# Patient Record
Sex: Female | Born: 1970 | Race: Black or African American | Hispanic: No | Marital: Married | Smoking: Never smoker
Health system: Southern US, Community
[De-identification: ages and names within clinical notes are randomized; demographics above are authoritative.]

## PROBLEM LIST (undated history)

## (undated) DIAGNOSIS — M329 Systemic lupus erythematosus, unspecified: Secondary | ICD-10-CM

## (undated) DIAGNOSIS — IMO0002 Reserved for concepts with insufficient information to code with codable children: Secondary | ICD-10-CM

## (undated) DIAGNOSIS — N289 Disorder of kidney and ureter, unspecified: Secondary | ICD-10-CM

## (undated) DIAGNOSIS — E282 Polycystic ovarian syndrome: Secondary | ICD-10-CM

## (undated) DIAGNOSIS — I10 Essential (primary) hypertension: Secondary | ICD-10-CM

## (undated) HISTORY — PX: DIALYSIS FISTULA CREATION: SHX611

---

## 2017-05-21 ENCOUNTER — Encounter (HOSPITAL_BASED_OUTPATIENT_CLINIC_OR_DEPARTMENT_OTHER): Payer: Self-pay

## 2017-05-21 ENCOUNTER — Emergency Department (HOSPITAL_BASED_OUTPATIENT_CLINIC_OR_DEPARTMENT_OTHER)
Admission: EM | Admit: 2017-05-21 | Discharge: 2017-05-21 | Disposition: A | Discharge status: Home / Self Care | Payer: BLUE CROSS/BLUE SHIELD | Attending: Emergency Medicine | Admitting: Emergency Medicine

## 2017-05-21 DIAGNOSIS — M25572 Pain in left ankle and joints of left foot: Secondary | ICD-10-CM | POA: Diagnosis not present

## 2017-05-21 DIAGNOSIS — I10 Essential (primary) hypertension: Secondary | ICD-10-CM | POA: Diagnosis not present

## 2017-05-21 HISTORY — DX: Reserved for concepts with insufficient information to code with codable children: IMO0002

## 2017-05-21 HISTORY — DX: Systemic lupus erythematosus, unspecified: M32.9

## 2017-05-21 HISTORY — DX: Disorder of kidney and ureter, unspecified: N28.9

## 2017-05-21 HISTORY — DX: Polycystic ovarian syndrome: E28.2

## 2017-05-21 HISTORY — DX: Essential (primary) hypertension: I10

## 2017-05-21 MED ORDER — PREDNISONE 10 MG PO TABS
60.0000 mg | ORAL_TABLET | Freq: Once | ORAL | Status: AC
  Administered 2017-05-21: 60 mg via ORAL
  Filled 2017-05-21: qty 1

## 2017-05-21 MED ORDER — PREDNISONE 10 MG (21) PO TBPK
ORAL_TABLET | Freq: Every day | ORAL | 0 refills | Status: DC
Start: 2017-05-21 — End: 2017-09-21

## 2017-05-21 NOTE — ED Triage Notes (Signed)
C/o swelling to left ankle-hx of same-denies injury-NAD-presents to triage in w/c

## 2017-05-21 NOTE — Discharge Instructions (Signed)
Take steroids for the next week Return for worsening symptoms

## 2017-05-21 NOTE — ED Notes (Signed)
ED Provider at bedside. 

## 2017-05-21 NOTE — ED Provider Notes (Signed)
[facility] Provider Note   CSN: [id number] Arrival date & time: 05/21/17  2020     History   Chief Complaint Chief Complaint  Patient presents with  . Joint Swelling    HPI Sara Frazier is a 46 y.o. female who presents with achy joints and left ankle pain and swelling. Past medical history significant for lupus and end-stage renal disease. She states that she's had aching hips and knees for the past week because of the rain. Today she developed severe left ankle pain and swelling. She says she's had this before and been treated with steroids with relief. Her last flare was in April. She denies history of diabetes. She is from [place] however had to have surgery in the area for a fistula which was placed in July.   HPI  Past Medical History:  Diagnosis Date  . Hypertension   . Kidney disease   . Lupus   . PCOS (polycystic ovarian syndrome)     There are no active problems to display for this patient.   Past Surgical History:  Procedure Laterality Date  . DIALYSIS FISTULA CREATION      OB History    No data available       Home Medications    Prior to Admission medications   Not on File    Family History No family history on file.  Social History Social History  Substance Use Topics  . Smoking status: Never Smoker  . Smokeless tobacco: Never Used  . Alcohol use No     Allergies   Lisinopril and Nitroglycerin   Review of Systems Review of Systems  Constitutional: Negative for fever.  Musculoskeletal: Positive for arthralgias, gait problem and joint swelling. Negative for myalgias.  Skin: Negative for wound.  Neurological: Negative for weakness and numbness.     Physical Exam Updated Vital Signs BP 140/82 (BP Location: Right Arm)   Pulse 76   Temp 100.2 F (37.9 C) (Oral)   Resp 20   Ht 5\' 5"  (1.651 m)   Wt 135.6 kg (299 lb)   SpO2 100%   BMI 49.76 kg/m   Physical Exam  Constitutional: She is oriented to person,  place, and time. She appears well-developed and well-nourished. No distress.  HENT:  Head: Normocephalic and atraumatic.  Eyes: Pupils are equal, round, and reactive to light. Conjunctivae are normal. Right eye exhibits no discharge. Left eye exhibits no discharge. No scleral icterus.  Neck: Normal range of motion.  Cardiovascular: Normal rate.   Pulmonary/Chest: Effort normal. No respiratory distress.  Abdominal: She exhibits no distension.  Musculoskeletal:  Left ankle: Moderate amount of swelling of the ankle. No redness or warmth. Tenderness to palpation of ankle. FROM. N/V intact.   Neurological: She is alert and oriented to person, place, and time.  Skin: Skin is warm and dry.  Psychiatric: She has a normal mood and affect. Her behavior is normal.  Nursing note and vitals reviewed.    ED Treatments / Results  Labs (all labs ordered are listed, but only abnormal results are displayed) Labs Reviewed - No data to display  EKG  EKG Interpretation None       Radiology No results found.  Procedures Procedures (including critical care time)  Medications Ordered in ED Medications  predniSONE (DELTASONE) tablet 60 mg (60 mg Oral Given 05/21/17 2130)     Initial Impression / Assessment and Plan / ED Course  I have reviewed the triage vital signs and the nursing notes.  Pertinent labs & imaging results that were available during my care of the patient were reviewed by me and considered in my medical decision making (see chart for details).  46 year old female with ankle pain and swelling likely due to lupus flare. She has an elevated temperature but vital signs are otherwise normal. She states that she has had this pain before and is usually treated with steroids. We will prescribe steroid taper. Return precautions given.  Final Clinical Impressions(s) / ED Diagnoses   Final diagnoses:  Acute left ankle pain    New Prescriptions New Prescriptions   No medications on  file     Bethel BornGekas, Katniss Weedman Marie, Cordelia Poche-C 05/22/17 0050    Alvira MondaySchlossman, Erin, MD 05/26/17 2216

## 2017-08-20 ENCOUNTER — Other Ambulatory Visit: Payer: Self-pay | Admitting: Family Medicine

## 2017-08-20 DIAGNOSIS — Z1231 Encounter for screening mammogram for malignant neoplasm of breast: Secondary | ICD-10-CM

## 2017-08-21 ENCOUNTER — Other Ambulatory Visit (HOSPITAL_COMMUNITY): Payer: Self-pay | Admitting: *Deleted

## 2017-08-21 NOTE — Discharge Instructions (Signed)
Epoetin Alfa injection °What is this medicine? °EPOETIN ALFA (e POE e tin AL fa) helps your body make more red blood cells. This medicine is used to treat anemia caused by chronic kidney failure, cancer chemotherapy, or HIV-therapy. It may also be used before surgery if you have anemia. °This medicine may be used for other purposes; ask your health care provider or pharmacist if you have questions. °COMMON BRAND NAME(S): Epogen, Procrit °What should I tell my health care provider before I take this medicine? °They need to know if you have any of these conditions: °-blood clotting disorders °-cancer patient not on chemotherapy °-cystic fibrosis °-heart disease, such as angina or heart failure °-hemoglobin level of 12 g/dL or greater °-high blood pressure °-low levels of folate, iron, or vitamin B12 °-seizures °-an unusual or allergic reaction to erythropoietin, albumin, benzyl alcohol, hamster proteins, other medicines, foods, dyes, or preservatives °-pregnant or trying to get pregnant °-breast-feeding °How should I use this medicine? °This medicine is for injection into a vein or under the skin. It is usually given by a health care professional in a hospital or clinic setting. °If you get this medicine at home, you will be taught how to prepare and give this medicine. Use exactly as directed. Take your medicine at regular intervals. Do not take your medicine more often than directed. °It is important that you put your used needles and syringes in a special sharps container. Do not put them in a trash can. If you do not have a sharps container, call your pharmacist or healthcare provider to get one. °A special MedGuide will be given to you by the pharmacist with each prescription and refill. Be sure to read this information carefully each time. °Talk to your pediatrician regarding the use of this medicine in children. While this drug may be prescribed for selected conditions, precautions do apply. °Overdosage: If you  think you have taken too much of this medicine contact a poison control center or emergency room at once. °NOTE: This medicine is only for you. Do not share this medicine with others. °What if I miss a dose? °If you miss a dose, take it as soon as you can. If it is almost time for your next dose, take only that dose. Do not take double or extra doses. °What may interact with this medicine? °Do not take this medicine with any of the following medications: °-darbepoetin alfa °This list may not describe all possible interactions. Give your health care provider a list of all the medicines, herbs, non-prescription drugs, or dietary supplements you use. Also tell them if you smoke, drink alcohol, or use illegal drugs. Some items may interact with your medicine. °What should I watch for while using this medicine? °Your condition will be monitored carefully while you are receiving this medicine. °You may need blood work done while you are taking this medicine. °What side effects may I notice from receiving this medicine? °Side effects that you should report to your doctor or health care professional as soon as possible: °-allergic reactions like skin rash, itching or hives, swelling of the face, lips, or tongue °-breathing problems °-changes in vision °-chest pain °-confusion, trouble speaking or understanding °-feeling faint or lightheaded, falls °-high blood pressure °-muscle aches or pains °-pain, swelling, warmth in the leg °-rapid weight gain °-severe headaches °-sudden numbness or weakness of the face, arm or leg °-trouble walking, dizziness, loss of balance or coordination °-seizures (convulsions) °-swelling of the ankles, feet, hands °-unusually weak or tired °  Side effects that usually do not require medical attention (report to your doctor or health care professional if they continue or are bothersome): °-diarrhea °-fever, chills (flu-like symptoms) °-headaches °-nausea, vomiting °-redness, stinging, or swelling at  site where injected °This list may not describe all possible side effects. Call your doctor for medical advice about side effects. You may report side effects to FDA at 1-800-FDA-1088. °Where should I keep my medicine? °Keep out of the reach of children. °Store in a refrigerator between 2 and 8 degrees C (36 and 46 degrees F). Do not freeze or shake. Throw away any unused portion if using a single-dose vial. Multi-dose vials can be kept in the refrigerator for up to 21 days after the initial dose. Throw away unused medicine. °NOTE: This sheet is a summary. It may not cover all possible information. If you have questions about this medicine, talk to your doctor, pharmacist, or health care provider. °© 2018 Elsevier/Gold Standard (2016-05-22 19:42:31) ° °

## 2017-08-22 ENCOUNTER — Ambulatory Visit (HOSPITAL_COMMUNITY)
Admission: RE | Admit: 2017-08-22 | Discharge: 2017-08-22 | Disposition: A | Discharge status: Home / Self Care | Payer: BLUE CROSS/BLUE SHIELD | Source: Ambulatory Visit | Attending: Nephrology | Admitting: Nephrology

## 2017-08-22 DIAGNOSIS — N185 Chronic kidney disease, stage 5: Secondary | ICD-10-CM | POA: Diagnosis not present

## 2017-08-22 DIAGNOSIS — D631 Anemia in chronic kidney disease: Secondary | ICD-10-CM | POA: Diagnosis not present

## 2017-08-22 LAB — POCT HEMOGLOBIN-HEMACUE: Hemoglobin: 10.2 g/dL — ABNORMAL LOW (ref 12.0–15.0)

## 2017-08-22 MED ORDER — EPOETIN ALFA 20000 UNIT/ML IJ SOLN
20000.0000 [IU] | Freq: Once | INTRAMUSCULAR | Status: AC
  Administered 2017-08-22: 20000 [IU] via SUBCUTANEOUS

## 2017-08-22 MED ORDER — EPOETIN ALFA 20000 UNIT/ML IJ SOLN
INTRAMUSCULAR | Status: AC
  Administered 2017-08-22: 20000 [IU] via SUBCUTANEOUS
  Filled 2017-08-22: qty 1

## 2017-08-23 ENCOUNTER — Encounter: Payer: Self-pay | Admitting: Nephrology

## 2017-08-23 DIAGNOSIS — D509 Iron deficiency anemia, unspecified: Secondary | ICD-10-CM | POA: Insufficient documentation

## 2017-08-23 DIAGNOSIS — D638 Anemia in other chronic diseases classified elsewhere: Secondary | ICD-10-CM | POA: Insufficient documentation

## 2017-08-24 ENCOUNTER — Ambulatory Visit: Payer: BLUE CROSS/BLUE SHIELD

## 2017-08-28 ENCOUNTER — Encounter (HOSPITAL_COMMUNITY): Payer: BLUE CROSS/BLUE SHIELD

## 2017-08-29 ENCOUNTER — Ambulatory Visit (INDEPENDENT_AMBULATORY_CARE_PROVIDER_SITE_OTHER): Payer: BLUE CROSS/BLUE SHIELD

## 2017-08-29 DIAGNOSIS — Z1231 Encounter for screening mammogram for malignant neoplasm of breast: Secondary | ICD-10-CM

## 2017-09-07 ENCOUNTER — Encounter (HOSPITAL_COMMUNITY)
Admission: RE | Admit: 2017-09-07 | Discharge: 2017-09-07 | Disposition: A | Discharge status: Home / Self Care | Payer: BLUE CROSS/BLUE SHIELD | Source: Ambulatory Visit | Attending: Nephrology | Admitting: Nephrology

## 2017-09-07 VITALS — BP 130/76 | HR 70 | Temp 98.3°F | Resp 18

## 2017-09-07 DIAGNOSIS — D508 Other iron deficiency anemias: Secondary | ICD-10-CM | POA: Diagnosis not present

## 2017-09-07 MED ORDER — EPOETIN ALFA 10000 UNIT/ML IJ SOLN
INTRAMUSCULAR | Status: AC
  Filled 2017-09-07: qty 1

## 2017-09-07 MED ORDER — EPOETIN ALFA 20000 UNIT/ML IJ SOLN
20000.0000 [IU] | INTRAMUSCULAR | Status: DC
  Administered 2017-09-07: 20000 [IU] via SUBCUTANEOUS

## 2017-09-10 LAB — POCT HEMOGLOBIN-HEMACUE: Hemoglobin: 8.7 g/dL — ABNORMAL LOW (ref 12.0–15.0)

## 2017-09-21 ENCOUNTER — Encounter (HOSPITAL_COMMUNITY)
Admission: RE | Admit: 2017-09-21 | Discharge: 2017-09-21 | Disposition: A | Discharge status: Home / Self Care | Payer: BLUE CROSS/BLUE SHIELD | Source: Ambulatory Visit | Attending: Nephrology | Admitting: Nephrology

## 2017-09-21 VITALS — BP 126/72 | HR 72 | Temp 98.2°F

## 2017-09-21 DIAGNOSIS — N185 Chronic kidney disease, stage 5: Secondary | ICD-10-CM | POA: Diagnosis not present

## 2017-09-21 DIAGNOSIS — D631 Anemia in chronic kidney disease: Secondary | ICD-10-CM | POA: Diagnosis not present

## 2017-09-21 DIAGNOSIS — D508 Other iron deficiency anemias: Secondary | ICD-10-CM

## 2017-09-21 MED ORDER — EPOETIN ALFA 20000 UNIT/ML IJ SOLN: 20000.0000 [IU] | INTRAMUSCULAR | Status: DC

## 2017-09-21 MED ORDER — EPOETIN ALFA 20000 UNIT/ML IJ SOLN
INTRAMUSCULAR | Status: AC
  Administered 2017-09-21: 20000 [IU]
  Filled 2017-09-21: qty 1

## 2017-09-25 LAB — POCT HEMOGLOBIN-HEMACUE: Hemoglobin: 9 g/dL — ABNORMAL LOW (ref 12.0–15.0)

## 2017-10-01 ENCOUNTER — Encounter (HOSPITAL_BASED_OUTPATIENT_CLINIC_OR_DEPARTMENT_OTHER): Payer: Self-pay

## 2017-10-01 ENCOUNTER — Other Ambulatory Visit: Payer: Self-pay

## 2017-10-01 ENCOUNTER — Emergency Department (HOSPITAL_BASED_OUTPATIENT_CLINIC_OR_DEPARTMENT_OTHER)
Admission: EM | Admit: 2017-10-01 | Discharge: 2017-10-01 | Disposition: A | Discharge status: Home / Self Care | Payer: BLUE CROSS/BLUE SHIELD | Attending: Emergency Medicine | Admitting: Emergency Medicine

## 2017-10-01 DIAGNOSIS — N185 Chronic kidney disease, stage 5: Secondary | ICD-10-CM | POA: Insufficient documentation

## 2017-10-01 DIAGNOSIS — Z79899 Other long term (current) drug therapy: Secondary | ICD-10-CM | POA: Diagnosis not present

## 2017-10-01 DIAGNOSIS — R110 Nausea: Secondary | ICD-10-CM | POA: Diagnosis present

## 2017-10-01 DIAGNOSIS — N179 Acute kidney failure, unspecified: Secondary | ICD-10-CM

## 2017-10-01 DIAGNOSIS — I120 Hypertensive chronic kidney disease with stage 5 chronic kidney disease or end stage renal disease: Secondary | ICD-10-CM | POA: Insufficient documentation

## 2017-10-01 LAB — COMPREHENSIVE METABOLIC PANEL
ALK PHOS: 49 U/L (ref 38–126)
ALT: 8 U/L — ABNORMAL LOW (ref 14–54)
ANION GAP: 10 (ref 5–15)
AST: 13 U/L — ABNORMAL LOW (ref 15–41)
Albumin: 3.5 g/dL (ref 3.5–5.0)
BILIRUBIN TOTAL: 0.3 mg/dL (ref 0.3–1.2)
BUN: 54 mg/dL — ABNORMAL HIGH (ref 6–20)
CALCIUM: 8.8 mg/dL — AB (ref 8.9–10.3)
CO2: 17 mmol/L — ABNORMAL LOW (ref 22–32)
CREATININE: 8.72 mg/dL — AB (ref 0.44–1.00)
Chloride: 107 mmol/L (ref 101–111)
GFR, EST AFRICAN AMERICAN: 6 mL/min — AB (ref >60)
GFR, EST NON AFRICAN AMERICAN: 5 mL/min — AB (ref >60)
Glucose, Bld: 95 mg/dL (ref 65–99)
Potassium: 4.1 mmol/L (ref 3.5–5.1)
Sodium: 134 mmol/L — ABNORMAL LOW (ref 135–145)
TOTAL PROTEIN: 7.2 g/dL (ref 6.5–8.1)

## 2017-10-01 LAB — CBC WITH DIFFERENTIAL/PLATELET
Basophils Absolute: 0 10*3/uL (ref 0.0–0.1)
Basophils Relative: 0 %
EOS ABS: 0 10*3/uL (ref 0.0–0.7)
Eosinophils Relative: 1 %
HEMATOCRIT: 28.4 % — AB (ref 36.0–46.0)
HEMOGLOBIN: 8.9 g/dL — AB (ref 12.0–15.0)
LYMPHS ABS: 1.5 10*3/uL (ref 0.7–4.0)
LYMPHS PCT: 38 %
MCH: 26.3 pg (ref 26.0–34.0)
MCHC: 31.3 g/dL (ref 30.0–36.0)
MCV: 83.8 fL (ref 78.0–100.0)
MONOS PCT: 12 %
Monocytes Absolute: 0.5 10*3/uL (ref 0.1–1.0)
NEUTROS PCT: 49 %
Neutro Abs: 1.9 10*3/uL (ref 1.7–7.7)
Platelets: 245 10*3/uL (ref 150–400)
RBC: 3.39 MIL/uL — ABNORMAL LOW (ref 3.87–5.11)
RDW: 14.6 % (ref 11.5–15.5)
WBC: 3.8 10*3/uL — ABNORMAL LOW (ref 4.0–10.5)

## 2017-10-01 NOTE — ED Provider Notes (Signed)
[facility] EMERGENCY DEPARTMENT Provider Note   CSN: [id number] Arrival date & time: 10/01/17  1803     History   Chief Complaint Chief Complaint  Patient presents with  . Abnormal Lab    HPI Sara Frazier is a 46 y.o. female history of lupus, hypertension, stage V kidney disease here presenting with abnormal labs.  Patient states that she had the flu earlier this year and her creatinine bumped from 3 to 5.  She states that she has a left arm fistula placed in June of this year and she just moved from [place].  She has been getting monthly lab checks to monitor her kidney function.  She had labs drawn on 12/13 that resulted today that showed BUN 60 and Cr 8.6 and resulted today and she was sent for evaluation.  Patient states that she has chronic nausea and chronic diarrhea that is not getting worse.  Had an episode of vomiting 2 days ago but has been keeping fluids down the last 2 days.  Patient also has chronic shortness of breath that is not getting worse and no chest pain.  She states that she urinates frequently.  She has seen [facility] and has a follow-up next week. She is not on meds for lupus and has multiple family members on dialysis.   The history is provided by the patient.    Past Medical History:  Diagnosis Date  . Hypertension   . Kidney disease   . Lupus   . PCOS (polycystic ovarian syndrome)     Patient Active Problem List   Diagnosis Date Noted  . Iron deficiency anemia 08/23/2017  . Anemia of chronic disease 08/23/2017    Past Surgical History:  Procedure Laterality Date  . DIALYSIS FISTULA CREATION      OB History    No data available       Home Medications    Prior to Admission medications   Medication Sig Start Date End Date Taking? Authorizing Provider  aliskiren (TEKTURNA) 150 MG tablet Take 150 mg by mouth daily. 02/15/16   [provider]  calcitRIOL (ROCALTROL) 0.25 MCG capsule Take 0.25 mcg by mouth daily.     [provider]  carvedilol (COREG) 25 MG tablet Take 25 mg by mouth 2 (two) times daily. 02/09/16   [provider]  gabapentin (NEURONTIN) 300 MG capsule Take 600 mg by mouth every other day. 02/04/16   [provider]  HYDROcodone-acetaminophen (NORCO) 7.5-325 MG tablet Take 1 tablet by mouth every 6 (six) hours as needed for pain. 12/07/15   [provider]  levothyroxine (SYNTHROID) 88 MCG tablet Take 88 mcg by mouth daily. 07/10/17 07/10/18  [provider]  montelukast (SINGULAIR) 10 MG tablet Take 10 mg by mouth at bedtime. 02/14/16 09/21/17  [provider]  Multiple Vitamin (MULTI-VITAMIN PO) Take 1 tablet by mouth daily.    [provider]  mycophenolate (CELLCEPT) 500 MG tablet Take 1,000 mg by mouth 2 (two) times daily. 02/09/16   [provider]    Family History No family history on file.  Social History Social History   Tobacco Use  . Smoking status: Never Smoker  . Smokeless tobacco: Never Used  Substance Use Topics  . Alcohol use: No  . Drug use: No     Allergies   Lisinopril and Nitroglycerin   Review of Systems Review of Systems  Gastrointestinal: Positive for nausea.  All other systems reviewed and are negative.  Physical Exam Updated Vital Signs BP (!) 141/79   Pulse 87   Temp 98.7 F (37.1 C) (Oral)   Resp 18   Ht 5' 5.5" (1.664 m)   Wt (!) 137.9 kg (304 lb)   LMP 08/20/2017   SpO2 99%   BMI 49.82 kg/m   Physical Exam  Constitutional: She is oriented to person, place, and time. She appears well-developed.  HENT:  Head: Normocephalic.  Mouth/Throat: Oropharynx is clear and moist.  Eyes: Conjunctivae and EOM are normal. Pupils are equal, round, and reactive to light.  Neck: Normal range of motion. Neck supple.  Cardiovascular: Normal rate, regular rhythm and normal heart sounds.  Pulmonary/Chest: Effort normal and breath sounds normal. No stridor. No respiratory distress.  She has no wheezes.  Abdominal: Soft. Bowel sounds are normal. She exhibits no distension. There is no tenderness. There is no guarding.  Musculoskeletal: Normal range of motion.  Neurological: She is alert and oriented to person, place, and time.  Skin: Skin is warm.  Psychiatric: She has a normal mood and affect.  Nursing note and vitals reviewed.    ED Treatments / Results  Labs (all labs ordered are listed, but only abnormal results are displayed) Labs Reviewed  CBC WITH DIFFERENTIAL/PLATELET - Abnormal; Notable for the following components:      Result Value   WBC 3.8 (*)    RBC 3.39 (*)    Hemoglobin 8.9 (*)    HCT 28.4 (*)    All other components within normal limits  COMPREHENSIVE METABOLIC PANEL - Abnormal; Notable for the following components:   Sodium 134 (*)    CO2 17 (*)    BUN 54 (*)    Creatinine, Ser 8.72 (*)    Calcium 8.8 (*)    AST 13 (*)    ALT 8 (*)    GFR calc non Af Amer 5 (*)    GFR calc Af Amer 6 (*)    All other components within normal limits  URINALYSIS, ROUTINE W REFLEX MICROSCOPIC    EKG  EKG Interpretation  Date/Time:  Monday October 01 2017 18:31:56 EST Ventricular Rate:  78 PR Interval:    QRS Duration: 89 QT Interval:  377 QTC Calculation: 430 R Axis:   63 Text Interpretation:  Sinus rhythm Probable left atrial enlargement Low voltage, precordial leads No previous ECGs available Confirmed by Richardean CanalYao, Mailey Landstrom H [id number]) on 10/01/2017 6:36:02 PM       Radiology No results found.  Procedures Procedures (including critical care time)  Medications Ordered in ED Medications - No data to display   Initial Impression / Assessment and Plan / ED Course  I have reviewed the triage vital signs and the nursing notes.  Pertinent labs & imaging results that were available during my care of the patient were reviewed by me and considered in my medical decision making (see chart for details).     Sara Frazier is a 46 y.o. female here  with renal failure. Patient has chronic nausea, chronic SOB with no worsening symptoms. Cr was 8.6 and BUN was 60 4 days ago. Will recheck labs.   7:19 PM Repeat labs showed Cr 8.7, BUN 54. K 4.1. I called Dr. Lacy Du[place], who saw patient recently. She states that patient can follow up in office as scheduled next week and doesn't need emergent dialysis. Gave strict return precautions.    Final Clinical Impressions(s) / ED Diagnoses   Final diagnoses:  None    ED Discharge Orders  None       Charlynne PanderYao, Shalicia Craghead Hsienta, MD 10/01/17 Jerene [id number]

## 2017-10-01 NOTE — Discharge Instructions (Signed)
Continue your current meds.   See your nephrologist in the office this week as scheduled   Return to ER if you have worse shortness of breath, vomiting, dehydration, fevers.

## 2017-10-01 NOTE — ED Triage Notes (Signed)
Pt had labs drawn on 12/13. Pt called and informed to come to ED due to BUN 60 and creatinine 8.6. Pt sts chronic kidney disease stage 5, pt does not received dialysis. Pt has fistula on left side.

## 2017-10-04 ENCOUNTER — Encounter (HOSPITAL_COMMUNITY)
Admission: RE | Admit: 2017-10-04 | Discharge: 2017-10-04 | Disposition: A | Discharge status: Home / Self Care | Payer: BLUE CROSS/BLUE SHIELD | Source: Ambulatory Visit | Attending: Nephrology | Admitting: Nephrology

## 2017-10-04 VITALS — BP 123/66 | HR 83 | Resp 18

## 2017-10-04 DIAGNOSIS — N185 Chronic kidney disease, stage 5: Secondary | ICD-10-CM | POA: Diagnosis not present

## 2017-10-04 DIAGNOSIS — D508 Other iron deficiency anemias: Secondary | ICD-10-CM

## 2017-10-04 LAB — POCT HEMOGLOBIN-HEMACUE: Hemoglobin: 8.5 g/dL — ABNORMAL LOW (ref 12.0–15.0)

## 2017-10-04 MED ORDER — EPOETIN ALFA 20000 UNIT/ML IJ SOLN
INTRAMUSCULAR | Status: AC
  Administered 2017-10-04: 20000 [IU] via SUBCUTANEOUS
  Filled 2017-10-04: qty 1

## 2017-10-04 MED ORDER — EPOETIN ALFA 20000 UNIT/ML IJ SOLN
20000.0000 [IU] | INTRAMUSCULAR | Status: DC
  Administered 2017-10-04: 20000 [IU] via SUBCUTANEOUS

## 2017-10-18 ENCOUNTER — Encounter (HOSPITAL_COMMUNITY)
Admission: RE | Admit: 2017-10-18 | Discharge: 2017-10-18 | Disposition: A | Discharge status: Home / Self Care | Payer: BLUE CROSS/BLUE SHIELD | Source: Ambulatory Visit | Attending: Nephrology | Admitting: Nephrology

## 2017-10-18 VITALS — BP 120/61 | HR 71 | Temp 98.6°F | Resp 18 | Ht 65.5 in | Wt 304.0 lb

## 2017-10-18 DIAGNOSIS — D508 Other iron deficiency anemias: Secondary | ICD-10-CM | POA: Diagnosis present

## 2017-10-18 LAB — RENAL FUNCTION PANEL
ANION GAP: 9 (ref 5–15)
Albumin: 3.1 g/dL — ABNORMAL LOW (ref 3.5–5.0)
BUN: 58 mg/dL — ABNORMAL HIGH (ref 6–20)
CALCIUM: 8.1 mg/dL — AB (ref 8.9–10.3)
CHLORIDE: 109 mmol/L (ref 101–111)
CO2: 18 mmol/L — ABNORMAL LOW (ref 22–32)
Creatinine, Ser: 9.69 mg/dL — ABNORMAL HIGH (ref 0.44–1.00)
GFR, EST AFRICAN AMERICAN: 5 mL/min — AB (ref >60)
GFR, EST NON AFRICAN AMERICAN: 4 mL/min — AB (ref >60)
Glucose, Bld: 90 mg/dL (ref 65–99)
Phosphorus: 5 mg/dL — ABNORMAL HIGH (ref 2.5–4.6)
Potassium: 4.5 mmol/L (ref 3.5–5.1)
Sodium: 136 mmol/L (ref 135–145)

## 2017-10-18 LAB — POCT HEMOGLOBIN-HEMACUE: HEMOGLOBIN: 8.4 g/dL — AB (ref 12.0–15.0)

## 2017-10-18 MED ORDER — DARBEPOETIN ALFA 200 MCG/0.4ML IJ SOSY
PREFILLED_SYRINGE | INTRAMUSCULAR | Status: AC
  Administered 2017-10-18: 200 ug
  Filled 2017-10-18: qty 0.4

## 2017-10-18 MED ORDER — FERUMOXYTOL INJECTION 510 MG/17 ML
510.0000 mg | INTRAVENOUS | Status: DC
  Administered 2017-10-18: 510 mg via INTRAVENOUS
  Filled 2017-10-18: qty 17

## 2017-10-18 MED ORDER — DARBEPOETIN ALFA 200 MCG/0.4ML IJ SOSY: 200.0000 ug | PREFILLED_SYRINGE | INTRAMUSCULAR | Status: DC

## 2017-10-18 NOTE — Discharge Instructions (Signed)

## 2017-10-19 ENCOUNTER — Encounter (HOSPITAL_COMMUNITY): Payer: Medicare Other

## 2017-10-19 LAB — PTH, INTACT AND CALCIUM
Calcium, Total (PTH): 7.8 mg/dL — ABNORMAL LOW (ref 8.7–10.2)
PTH: 498 pg/mL — ABNORMAL HIGH (ref 15–65)

## 2017-10-19 LAB — HEPATITIS B SURFACE ANTIGEN: Hepatitis B Surface Ag: NEGATIVE

## 2017-10-25 ENCOUNTER — Ambulatory Visit (HOSPITAL_COMMUNITY)
Admission: RE | Admit: 2017-10-25 | Discharge: 2017-10-25 | Disposition: A | Discharge status: Home / Self Care | Payer: BLUE CROSS/BLUE SHIELD | Source: Ambulatory Visit | Attending: Nephrology | Admitting: Nephrology

## 2017-10-25 VITALS — BP 124/68 | HR 64 | Temp 98.6°F | Resp 18 | Ht 65.5 in | Wt 302.0 lb

## 2017-10-25 DIAGNOSIS — D631 Anemia in chronic kidney disease: Secondary | ICD-10-CM | POA: Insufficient documentation

## 2017-10-25 DIAGNOSIS — D508 Other iron deficiency anemias: Secondary | ICD-10-CM | POA: Diagnosis present

## 2017-10-25 DIAGNOSIS — N189 Chronic kidney disease, unspecified: Secondary | ICD-10-CM | POA: Diagnosis not present

## 2017-10-25 LAB — POCT HEMOGLOBIN-HEMACUE: Hemoglobin: 8.9 g/dL — ABNORMAL LOW (ref 12.0–15.0)

## 2017-10-25 MED ORDER — SODIUM CHLORIDE 0.9 % IV SOLN
510.0000 mg | INTRAVENOUS | Status: DC
  Administered 2017-10-25: 510 mg via INTRAVENOUS
  Filled 2017-10-25: qty 17

## 2017-10-25 MED ORDER — DARBEPOETIN ALFA 200 MCG/0.4ML IJ SOSY
200.0000 ug | PREFILLED_SYRINGE | INTRAMUSCULAR | Status: DC
  Administered 2017-10-25: 200 ug via SUBCUTANEOUS

## 2017-10-25 MED ORDER — DARBEPOETIN ALFA 200 MCG/0.4ML IJ SOSY
PREFILLED_SYRINGE | INTRAMUSCULAR | Status: AC
  Filled 2017-10-25: qty 0.4

## 2017-11-02 ENCOUNTER — Encounter (HOSPITAL_COMMUNITY)
Admission: RE | Admit: 2017-11-02 | Discharge: 2017-11-02 | Disposition: A | Discharge status: Home / Self Care | Payer: BLUE CROSS/BLUE SHIELD | Source: Ambulatory Visit | Attending: Nephrology | Admitting: Nephrology

## 2017-11-02 VITALS — BP 123/67 | HR 73 | Temp 98.4°F | Resp 18

## 2017-11-02 DIAGNOSIS — D508 Other iron deficiency anemias: Secondary | ICD-10-CM

## 2017-11-02 LAB — IRON AND TIBC
IRON: 88 ug/dL (ref 28–170)
Saturation Ratios: 36 % — ABNORMAL HIGH (ref 10.4–31.8)
TIBC: 245 ug/dL — ABNORMAL LOW (ref 250–450)
UIBC: 157 ug/dL

## 2017-11-02 LAB — FERRITIN: FERRITIN: 737 ng/mL — AB (ref 11–307)

## 2017-11-02 LAB — POCT HEMOGLOBIN-HEMACUE: Hemoglobin: 8.5 g/dL — ABNORMAL LOW (ref 12.0–15.0)

## 2017-11-02 MED ORDER — DARBEPOETIN ALFA 200 MCG/0.4ML IJ SOSY
PREFILLED_SYRINGE | INTRAMUSCULAR | Status: AC
  Administered 2017-11-02: 200 ug via SUBCUTANEOUS
  Filled 2017-11-02: qty 0.4

## 2017-11-02 MED ORDER — DARBEPOETIN ALFA 200 MCG/0.4ML IJ SOSY
200.0000 ug | PREFILLED_SYRINGE | INTRAMUSCULAR | Status: DC
Start: 2017-11-02 — End: 2017-11-03
  Administered 2017-11-02: 200 ug via SUBCUTANEOUS

## 2017-11-09 ENCOUNTER — Encounter (HOSPITAL_COMMUNITY)
Admission: RE | Admit: 2017-11-09 | Discharge: 2017-11-09 | Disposition: A | Discharge status: Home / Self Care | Payer: BLUE CROSS/BLUE SHIELD | Source: Ambulatory Visit | Attending: Nephrology | Admitting: Nephrology

## 2017-11-09 DIAGNOSIS — D508 Other iron deficiency anemias: Secondary | ICD-10-CM | POA: Diagnosis not present

## 2017-11-09 LAB — POCT HEMOGLOBIN-HEMACUE: Hemoglobin: 8.5 g/dL — ABNORMAL LOW (ref 12.0–15.0)

## 2017-11-09 MED ORDER — DARBEPOETIN ALFA 200 MCG/0.4ML IJ SOSY
PREFILLED_SYRINGE | INTRAMUSCULAR | Status: AC
  Administered 2017-11-09: 200 ug via SUBCUTANEOUS
  Filled 2017-11-09: qty 0.4

## 2017-11-16 ENCOUNTER — Encounter (HOSPITAL_COMMUNITY)
Admission: RE | Admit: 2017-11-16 | Discharge: 2017-11-16 | Disposition: A | Discharge status: Home / Self Care | Payer: BLUE CROSS/BLUE SHIELD | Source: Ambulatory Visit | Attending: Nephrology | Admitting: Nephrology

## 2017-11-16 VITALS — BP 124/70 | HR 66 | Temp 98.1°F | Resp 18

## 2017-11-16 DIAGNOSIS — D508 Other iron deficiency anemias: Secondary | ICD-10-CM | POA: Insufficient documentation

## 2017-11-16 MED ORDER — DARBEPOETIN ALFA 200 MCG/0.4ML IJ SOSY
200.0000 ug | PREFILLED_SYRINGE | INTRAMUSCULAR | Status: DC
  Administered 2017-11-16: 200 ug via SUBCUTANEOUS

## 2017-11-16 MED ORDER — DARBEPOETIN ALFA 200 MCG/0.4ML IJ SOSY
PREFILLED_SYRINGE | INTRAMUSCULAR | Status: AC
  Administered 2017-11-16: 200 ug via SUBCUTANEOUS
  Filled 2017-11-16: qty 0.4

## 2017-11-19 LAB — POCT HEMOGLOBIN-HEMACUE: Hemoglobin: 8.5 g/dL — ABNORMAL LOW (ref 12.0–15.0)

## 2018-07-25 ENCOUNTER — Encounter: Payer: Self-pay | Admitting: Neurology

## 2018-08-28 ENCOUNTER — Ambulatory Visit (INDEPENDENT_AMBULATORY_CARE_PROVIDER_SITE_OTHER): Payer: BLUE CROSS/BLUE SHIELD | Admitting: Neurology

## 2018-08-28 ENCOUNTER — Encounter: Payer: Self-pay | Admitting: Neurology

## 2018-08-28 VITALS — BP 118/68 | HR 72 | Ht 65.5 in | Wt 292.0 lb

## 2018-08-28 DIAGNOSIS — R202 Paresthesia of skin: Secondary | ICD-10-CM

## 2018-08-28 DIAGNOSIS — S46011A Strain of muscle(s) and tendon(s) of the rotator cuff of right shoulder, initial encounter: Secondary | ICD-10-CM | POA: Diagnosis not present

## 2018-08-28 DIAGNOSIS — R200 Anesthesia of skin: Secondary | ICD-10-CM | POA: Diagnosis not present

## 2018-08-28 NOTE — Patient Instructions (Addendum)
1.  We will check nerve study of right upper extremity 2.  Start taking gabapentin 2 pills at bedtime 3.  We will provide you a script for a wrist splint.  Wear at night to see if it helps with symptoms. 4.  Follow up after testing. 5.  I would like you to see orthopedics for that shoulder as well.  We can discuss at next visit.

## 2018-08-28 NOTE — Progress Notes (Signed)
NEUROLOGY CONSULTATION NOTE  Sara Frazier MRN: [id number] DOB: February 07, 1971  Referring provider: Paulene Floor, MD Primary care provider: No PCP  Reason for consult:  paresthesias  HISTORY OF PRESENT ILLNESS: Sara Frazier is a 47 year old right-handed African American female with lupus, end-stage renal disease on hemodialysis, hypertension, and PCOS who presents for paresthesias.  She is accompanied by her mother who supplements history.  She is accompanied by her husband via telephone.  Beginning 2 months ago, she developed numbness and burning pain and swelling in the 3rd and 4th digits of both hands.  Now the left hand symptoms have resolved but the the right hand symptoms persist and radiate up the arm to the right shoulder and upper back.  There is no actual weakness but her arm and hand feels weak due to the pain and numbing sensation.  She reports 2-3 years ago, she had a fall and tore her rotator cuff but it was never fixed.  It is constant, but she notices it more when laying down or pressing on her back.  No associated neck pain.She also notices burning sensation in the 2nd and 3rd toe of the left foot.  There is no low back and radicular pain down the leg.  07/09/18:  Cr 10.3 (baseline)  PAST MEDICAL HISTORY: Past Medical History:  Diagnosis Date  . Hypertension   . Kidney disease   . Lupus   . PCOS (polycystic ovarian syndrome)     PAST SURGICAL HISTORY: Past Surgical History:  Procedure Laterality Date  . DIALYSIS FISTULA CREATION      MEDICATIONS: Current Outpatient Medications on File Prior to Visit  Medication Sig Dispense Refill  . aliskiren (TEKTURNA) 150 MG tablet Take 150 mg by mouth daily.    . calcitRIOL (ROCALTROL) 0.25 MCG capsule Take 0.25 mcg by mouth daily.    . carvedilol (COREG) 25 MG tablet Take 25 mg by mouth 2 (two) times daily.    Marland Kitchen gabapentin (NEURONTIN) 300 MG capsule Take 600 mg by mouth every other day.    Marland Kitchen HYDROcodone-acetaminophen  (NORCO) 7.5-325 MG tablet Take 1 tablet by mouth every 6 (six) hours as needed for pain.    Marland Kitchen levothyroxine (SYNTHROID) 88 MCG tablet Take 88 mcg by mouth daily.    . montelukast (SINGULAIR) 10 MG tablet Take 10 mg by mouth at bedtime.    . Multiple Vitamin (MULTI-VITAMIN PO) Take 1 tablet by mouth daily.    . mycophenolate (CELLCEPT) 500 MG tablet Take 1,000 mg by mouth 2 (two) times daily.     No current facility-administered medications on file prior to visit.     ALLERGIES: Allergies  Allergen Reactions  . Chlorhexidine Gluconate Itching and Rash  . Lisinopril   . Nitroglycerin     FAMILY HISTORY: No family history on file.  SOCIAL HISTORY: Social History   Socioeconomic History  . Marital status: Married    Spouse name: Not on file  . Number of children: Not on file  . Years of education: Not on file  . Highest education level: Not on file  Occupational History  . Not on file  Social Needs  . Financial resource strain: Not on file  . Food insecurity:    Worry: Not on file    Inability: Not on file  . Transportation needs:    Medical: Not on file    Non-medical: Not on file  Tobacco Use  . Smoking status: Never Smoker  . Smokeless tobacco: Never Used  Substance and Sexual Activity  . Alcohol use: No  . Drug use: No  . Sexual activity: Not on file  Lifestyle  . Physical activity:    Days per week: Not on file    Minutes per session: Not on file  . Stress: Not on file  Relationships  . Social connections:    Talks on phone: Not on file    Gets together: Not on file    Attends religious service: Not on file    Active member of club or organization: Not on file    Attends meetings of clubs or organizations: Not on file    Relationship status: Not on file  . Intimate partner violence:    Fear of current or ex partner: Not on file    Emotionally abused: Not on file    Physically abused: Not on file    Forced sexual activity: Not on file  Other Topics  Concern  . Not on file  Social History Narrative  . Not on file    REVIEW OF SYSTEMS: Constitutional: No fevers, chills, or sweats, no generalized fatigue, change in appetite Eyes: No visual changes, double vision, eye pain Ear, nose and throat: No hearing loss, ear pain, nasal congestion, sore throat Cardiovascular: No chest pain, palpitations Respiratory:  No shortness of breath at rest or with exertion, wheezes GastrointestinaI: No nausea, vomiting, diarrhea, abdominal pain, fecal incontinence Genitourinary:  No dysuria, urinary retention or frequency Musculoskeletal:  No neck pain, back pain Integumentary: No rash, pruritus, skin lesions Neurological: as above Psychiatric: No depression, insomnia, anxiety Endocrine: No palpitations, fatigue, diaphoresis, mood swings, change in appetite, change in weight, increased thirst Hematologic/Lymphatic:  No purpura, petechiae. Allergic/Immunologic: no itchy/runny eyes, nasal congestion, recent allergic reactions, rashes  PHYSICAL EXAM: Blood pressure 118/68, pulse 72, height 5' 5.5" (1.664 m), weight 292 lb (132.5 kg), SpO2 97 %. General: No acute distress.  Patient appears well-groomed.  Head:  Normocephalic/atraumatic Eyes:  fundi examined but not visualized Neck: supple, no paraspinal tenderness, full range of motion Back: No paraspinal tenderness Heart: regular rate and rhythm Lungs: Clear to auscultation bilaterally. Vascular: No carotid bruits. Neurological Exam: Mental status: alert and oriented to person, place, and time, recent and remote memory intact, fund of knowledge intact, attention and concentration intact, speech fluent and not dysarthric, language intact. Cranial nerves: CN I: not tested CN II: pupils equal, round and reactive to light, visual fields intact CN III, IV, VI:  full range of motion, no nystagmus, no ptosis CN V: facial sensation intact CN VII: upper and lower face symmetric CN VIII: hearing intact CN  IX, X: gag intact, uvula midline CN XI: sternocleidomastoid and trapezius muscles intact CN XII: tongue midline Bulk & Tone: normal, no fasciculations. Motor:  5/5 throughout  Sensation:  Endorses very mild decreased pinprick sensation in the left upper extremity; and vibration sensation intact. Deep Tendon Reflexes:  2+ throughout, toes downgoing.  Finger to nose testing:  Without dysmetria.  Heel to shin:  Without dysmetria.  Gait:  Normal station and stride.  Able to turn and tandem walk. Romberg negative.  IMPRESSION: Right upper extremity numbness and tingling with neuralgia.  May possibly be carpal tunnel syndrome as symptoms into fingers aggravated with Tinel's sign on wrist.  Carpal tunnel symptoms may radiate up to shoulders.  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  Numbness and tingling in toes of left foot may be mild lumbar radiculopathy  or possibly peroneal neuropathy but does not appear severe.  PLAN: 1.  NCV-EMG of right upper extremity 2.  Prescribed wrist splint to wear at night 3.  She has gabapentin which she takes off and on for pain.  Advised to start taking every night (600mg  at bedtime) 4.  I want to refer her to orthopedics for evaluation of her shoulder.  She defers at this time.  She would like to complete workup and revisit that discussion at next visit.  Thank you for allowing me to take part in the care of this patient.  Shon MilletAdam Jaffe, DO  CC:  Paulene FloorJames Lin, MD

## 2018-10-01 ENCOUNTER — Ambulatory Visit (INDEPENDENT_AMBULATORY_CARE_PROVIDER_SITE_OTHER): Payer: BLUE CROSS/BLUE SHIELD | Admitting: Neurology

## 2018-10-01 ENCOUNTER — Ambulatory Visit: Payer: Medicare Other | Admitting: Neurology

## 2018-10-01 DIAGNOSIS — R202 Paresthesia of skin: Secondary | ICD-10-CM | POA: Diagnosis not present

## 2018-10-01 DIAGNOSIS — R200 Anesthesia of skin: Secondary | ICD-10-CM | POA: Diagnosis not present

## 2018-10-01 DIAGNOSIS — G5601 Carpal tunnel syndrome, right upper limb: Secondary | ICD-10-CM

## 2018-10-01 NOTE — Procedures (Signed)
[facility]  [address], [address]  [place], [place] [address] Tel: [phone] Fax:  [phone] Test Date:  10/01/2018  Patient: Sara Frazier DOB: 1971-09-17 Physician: Nita Sickleonika Patel, DO  Sex: Female Height: 5\' 5"  Ref Phys: Shon MilletAdam Jaffe, DO  ID#: [id number] Temp: 35.0C Technician:    Patient Complaints: This is a 47 year old female with history of lupus and end-stage renal disease on hemodialysis referred for evaluation of right hand paresthesias.  NCV & EMG Findings: Sensitive electrodiagnostic testing of the right upper extremity shows:  1. Right median sensory response shows prolonged distal peak latency (4.4 ms) and reduced amplitude (18.9 V).  Right ulnar sensory response is within normal limits. 2. Right median motor response shows prolonged distal onset latency (4.0 ms) and normal amplitude.  Right ulnar motor responses within normal limits. 3. There is no evidence of active or chronic motor axonal loss changes affecting any of the tested muscles.  Motor unit configuration and recruitment pattern is within normal limits.  Impression: Right median neuropathy at or distal to the wrist, consistent with a clinical diagnosis of carpal tunnel syndrome.  Overall, these findings are moderate in degree electrically.   ___________________________ Nita Sickleonika Patel, DO    Nerve Conduction Studies Anti Sensory Summary Table   Site NR Peak (ms) Norm Peak (ms) P-T Amp (V) Norm P-T Amp  Right Median Anti Sensory (2nd Digit)  35C  Wrist    4.4 <3.4 18.9 >20  Right Ulnar Anti Sensory (5th Digit)  35C  Wrist    3.1 <3.1 44.2 >12   Motor Summary Table   Site NR Onset (ms) Norm Onset (ms) O-P Amp (mV) Norm O-P Amp Site1 Site2 Delta-0 (ms) Dist (cm) Vel (m/s) Norm Vel (m/s)  Right Median Motor (Abd Poll Brev)  35C  Wrist    4.0 <3.9 15.9 >6 Elbow Wrist 5.0 34.0 68 >50  Elbow    9.0  15.7         Right Ulnar Motor (Abd Dig Minimi)  35C  Wrist    2.6 <3.1 9.0 >7 B  Elbow Wrist 4.0 27.0 68 >50  B Elbow    6.6  8.5  A Elbow B Elbow 1.7 10.0 59 >50  A Elbow    8.3  8.4          EMG   Side Muscle Ins Act Fibs Psw Fasc Number Recrt Dur Dur. Amp Amp. Poly Poly. Comment  Right 1stDorInt Nml Nml Nml Nml Nml Nml Nml Nml Nml Nml Nml Nml N/A  Right Abd Poll Brev Nml Nml Nml Nml Nml Nml Nml Nml Nml Nml Nml Nml N/A  Right PronatorTeres Nml Nml Nml Nml Nml Nml Nml Nml Nml Nml Nml Nml N/A  Right Biceps Nml Nml Nml Nml Nml Nml Nml Nml Nml Nml Nml Nml N/A  Right Triceps Nml Nml Nml Nml Nml Nml Nml Nml Nml Nml Nml Nml N/A  Right Deltoid Nml Nml Nml Nml Nml Nml Nml Nml Nml Nml Nml Nml N/A      Waveforms:

## 2018-10-02 ENCOUNTER — Telehealth: Payer: Self-pay

## 2018-10-02 NOTE — Telephone Encounter (Signed)
-----   Message from Drema DallasAdam R Jaffe, DO sent at 10/01/2018 11:48 AM EST ----- Nerve test dose show evidence of carpal tunnel syndrome.  If wrist splints are helping, then continue using.  If not, then I would recommend referral to hand specialist .

## 2018-10-02 NOTE — Telephone Encounter (Signed)
Called [facility] for Pt to return my call 

## 2018-10-03 ENCOUNTER — Other Ambulatory Visit: Payer: Self-pay | Admitting: Family Medicine

## 2018-10-03 DIAGNOSIS — Z0000 Encounter for general adult medical examination without abnormal findings: Secondary | ICD-10-CM

## 2018-10-03 DIAGNOSIS — Z1231 Encounter for screening mammogram for malignant neoplasm of breast: Secondary | ICD-10-CM

## 2018-10-03 NOTE — Telephone Encounter (Signed)
Called and spoke with Pt, advised her of EMG results. She will consider and call back if wants referral to hand specialist

## 2018-10-24 ENCOUNTER — Ambulatory Visit: Payer: Medicare Other

## 2018-10-31 ENCOUNTER — Ambulatory Visit (INDEPENDENT_AMBULATORY_CARE_PROVIDER_SITE_OTHER): Payer: BC Managed Care – PPO

## 2018-10-31 DIAGNOSIS — Z1231 Encounter for screening mammogram for malignant neoplasm of breast: Secondary | ICD-10-CM | POA: Diagnosis not present

## 2018-12-17 NOTE — Progress Notes (Signed)
NEUROLOGY FOLLOW UP OFFICE NOTE  Sara Frazier [id number]  HISTORY OF PRESENT ILLNESS: Sara Frazier is a 48 year old right-handed African-American woman with lupus, end-stage renal disease on hemodialysis, hypertension, and PCOS who follows up for paresthesias.  She is accompanied by her mother who supplements history.  UPDATE:  She had a nerve conduction study on 10/01/2018 which demonstrated moderate right carpal tunnel syndrome.  She deferred referral to a hand specialist at that time.  She was prescribed a wrist splint,but she hasn't been using it because she hasn't found a place to buy it.    HISTORY:  In September 2019, she developed numbness and burning pain and swelling in the 3rd and 4th digits of both hands.  Now the left hand symptoms have resolved but the the right hand symptoms persist and radiate up the arm to the right shoulder and upper back.  There is no actual weakness but her arm and hand feels weak due to the pain and numbing sensation.  She reports 2-3 years ago, she had a fall and tore her rotator cuff but it was never fixed.  It is constant, but she notices it more when laying down or pressing on her back.  No associated neck pain.She also notices burning sensation in the 2nd and 3rd toe of the left foot.  There is no low back and radicular pain down the leg.  PAST MEDICAL HISTORY: Past Medical History:  Diagnosis Date  . Hypertension   . Kidney disease   . Lupus (HCC)   . PCOS (polycystic ovarian syndrome)     MEDICATIONS: Current Outpatient Medications on File Prior to Visit  Medication Sig Dispense Refill  . aliskiren (TEKTURNA) 150 MG tablet Take 150 mg by mouth daily.    Marland Kitchen atorvastatin (LIPITOR) 10 MG tablet Take 10 mg by mouth daily.    . B Complex-C-Zn-Folic Acid (DIALYVITE 800 WITH ZINC) 0.8 MG TABS Take 1 tablet by mouth daily.  4  . calcitRIOL (ROCALTROL) 0.25 MCG capsule Take 0.25 mcg by mouth daily.    . calcium acetate (PHOSLO) 667 MG  capsule TAKE 1 CAPSULE BY MOUTH THREE TIMES DAILY WITH MEALS    . carvedilol (COREG) 25 MG tablet Take 25 mg by mouth 2 (two) times daily.    . fluticasone (FLONASE) 50 MCG/ACT nasal spray INSTILL 2 SPRAYS INTO EACH NOSTRIL ONCE DAILY    . gabapentin (NEURONTIN) 300 MG capsule Take 600 mg by mouth every other day.    Marland Kitchen HYDROcodone-acetaminophen (NORCO) 7.5-325 MG tablet Take 1 tablet by mouth every 6 (six) hours as needed for pain.    Marland Kitchen levothyroxine (SYNTHROID) 88 MCG tablet Take 88 mcg by mouth daily.    . montelukast (SINGULAIR) 10 MG tablet Take 10 mg by mouth at bedtime.    . Multiple Vitamin (MULTI-VITAMIN PO) Take 1 tablet by mouth daily.    . mycophenolate (CELLCEPT) 500 MG tablet Take 1,000 mg by mouth 2 (two) times daily.    . norethindrone (AYGESTIN) 5 MG tablet Take 5 mg by mouth daily.     No current facility-administered medications on file prior to visit.     ALLERGIES: Allergies  Allergen Reactions  . Chlorhexidine Gluconate Itching and Rash  . Lisinopril   . Nitroglycerin     FAMILY HISTORY: Family History  Problem Relation Age of Onset  . Hypertension Mother   . Hypertension Father   . Diabetes Father   . Hypertension Sister   . Autoimmune disease Sister  SOCIAL HISTORY: Social History   Socioeconomic History  . Marital status: Married    Spouse name: Lyda Jester  . Number of children: 1  . Years of education: Not on file  . Highest education level: Some college, no degree  Occupational History  . Occupation: disabled  Social Needs  . Financial resource strain: Not on file  . Food insecurity:    Worry: Not on file    Inability: Not on file  . Transportation needs:    Medical: Not on file    Non-medical: Not on file  Tobacco Use  . Smoking status: Never Smoker  . Smokeless tobacco: Never Used  Substance and Sexual Activity  . Alcohol use: No  . Drug use: No  . Sexual activity: Not on file  Lifestyle  . Physical activity:    Days per week: Not  on file    Minutes per session: Not on file  . Stress: Not on file  Relationships  . Social connections:    Talks on phone: Not on file    Gets together: Not on file    Attends religious service: Not on file    Active member of club or organization: Not on file    Attends meetings of clubs or organizations: Not on file    Relationship status: Not on file  . Intimate partner violence:    Fear of current or ex partner: Not on file    Emotionally abused: Not on file    Physically abused: Not on file    Forced sexual activity: Not on file  Other Topics Concern  . Not on file  Social History Narrative   Patient is right-handed. She lives in a 2 story home with her husband. She does not drink caffeine, she does not exercise.    REVIEW OF SYSTEMS: Constitutional: No fevers, chills, or sweats, no generalized fatigue, change in appetite Eyes: No visual changes, double vision, eye pain Ear, nose and throat: No hearing loss, ear pain, nasal congestion, sore throat Cardiovascular: No chest pain, palpitations Respiratory:  No shortness of breath at rest or with exertion, wheezes GastrointestinaI: No nausea, vomiting, diarrhea, abdominal pain, fecal incontinence Genitourinary:  No dysuria, urinary retention or frequency Musculoskeletal:  No neck pain, back pain Integumentary: No rash, pruritus, skin lesions Neurological: as above Psychiatric: No depression, insomnia, anxiety Endocrine: No palpitations, fatigue, diaphoresis, mood swings, change in appetite, change in weight, increased thirst Hematologic/Lymphatic:  No purpura, petechiae. Allergic/Immunologic: no itchy/runny eyes, nasal congestion, recent allergic reactions, rashes  PHYSICAL EXAM: Blood pressure 120/74, pulse 88, height 5' 5.5" (1.664 m), weight 289 lb (131.1 kg), SpO2 99 %. General: No acute distress.  Patient appears well-groomed.   IMPRESSION: 1.  Right carpal tunnel syndrome  PLAN: 1.  She will get a wrist splint  for her right wrist. 2.  If splint ineffective, then she will contact me and we can refer her to hand specialist.  16 minutes spent face to face with patient, 100% spent discussing .  Shon Millet, DO

## 2018-12-19 ENCOUNTER — Encounter: Payer: Self-pay | Admitting: Neurology

## 2018-12-19 ENCOUNTER — Ambulatory Visit (INDEPENDENT_AMBULATORY_CARE_PROVIDER_SITE_OTHER): Payer: BLUE CROSS/BLUE SHIELD | Admitting: Neurology

## 2018-12-19 VITALS — BP 120/74 | HR 88 | Ht 65.5 in | Wt 289.0 lb

## 2018-12-19 DIAGNOSIS — G5601 Carpal tunnel syndrome, right upper limb: Secondary | ICD-10-CM | POA: Diagnosis not present

## 2018-12-19 NOTE — Patient Instructions (Signed)
1.  Try wearing the wrist splint 2.  If ineffective then contact me for referral to hand specialist

## 2018-12-19 NOTE — Addendum Note (Signed)
Addended by: Vivien Rota on: 10/21/1094 09:55 AM   Modules accepted: Orders

## 2019-08-27 ENCOUNTER — Other Ambulatory Visit: Payer: Self-pay | Admitting: Family Medicine

## 2019-08-27 DIAGNOSIS — Z1231 Encounter for screening mammogram for malignant neoplasm of breast: Secondary | ICD-10-CM

## 2019-10-30 ENCOUNTER — Ambulatory Visit: Payer: BC Managed Care – PPO

## 2019-11-13 ENCOUNTER — Ambulatory Visit: Payer: BC Managed Care – PPO

## 2019-11-20 ENCOUNTER — Ambulatory Visit (INDEPENDENT_AMBULATORY_CARE_PROVIDER_SITE_OTHER): Payer: BC Managed Care – PPO

## 2019-11-20 ENCOUNTER — Other Ambulatory Visit: Payer: Self-pay

## 2019-11-20 DIAGNOSIS — Z1231 Encounter for screening mammogram for malignant neoplasm of breast: Secondary | ICD-10-CM | POA: Diagnosis not present

## 2020-10-04 ENCOUNTER — Other Ambulatory Visit: Payer: Self-pay | Admitting: Family Medicine

## 2020-10-04 DIAGNOSIS — Z1231 Encounter for screening mammogram for malignant neoplasm of breast: Secondary | ICD-10-CM

## 2020-11-25 ENCOUNTER — Other Ambulatory Visit: Payer: Self-pay

## 2020-11-25 ENCOUNTER — Ambulatory Visit (INDEPENDENT_AMBULATORY_CARE_PROVIDER_SITE_OTHER): Payer: Medicare Other

## 2020-11-25 DIAGNOSIS — Z1231 Encounter for screening mammogram for malignant neoplasm of breast: Secondary | ICD-10-CM

## 2021-05-17 IMAGING — MG DIGITAL SCREENING BILAT W/ TOMO W/ CAD
8 of 16 series · 8 of 40 positions shown · non-contrast
Comparison: Previous exam(s).

CLINICAL DATA: Screening.

EXAM:
DIGITAL SCREENING BILATERAL MAMMOGRAM WITH TOMO AND CAD

[L MLO synth-2D (1 of 3)]
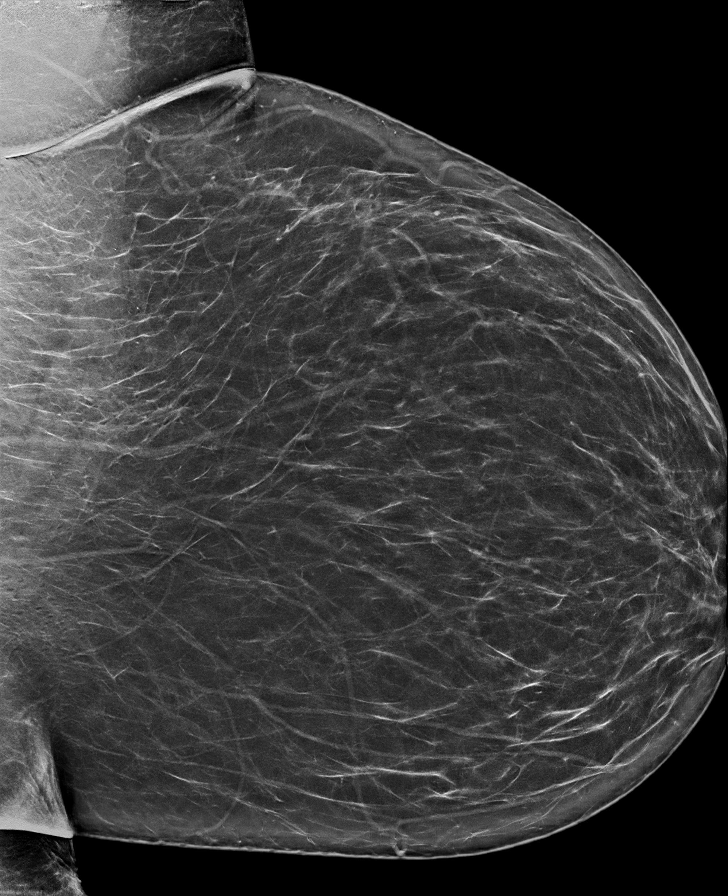

[R CC synth-2D]
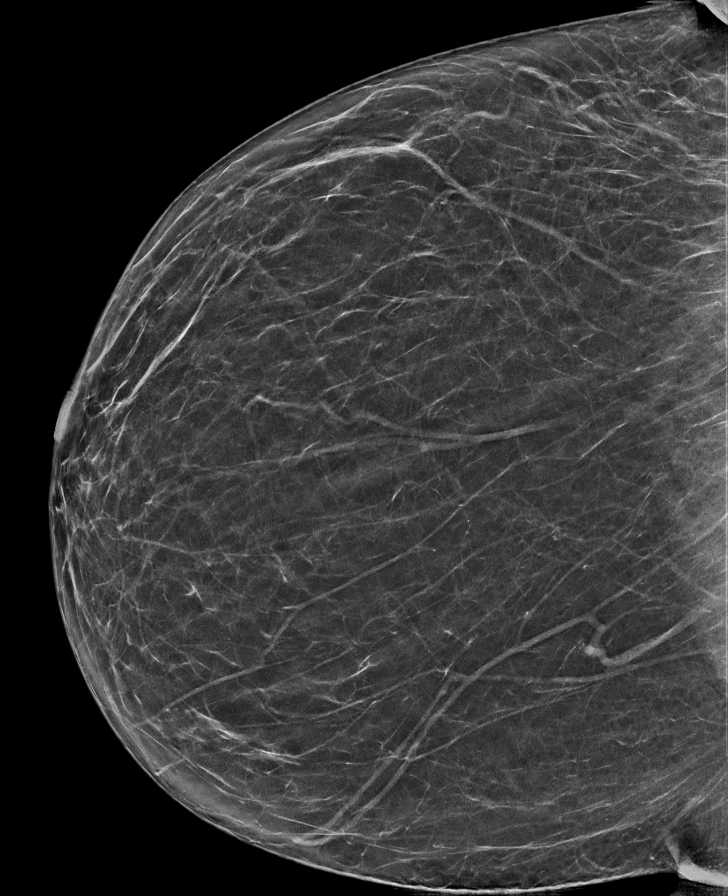

[R XCCM synth-2D]
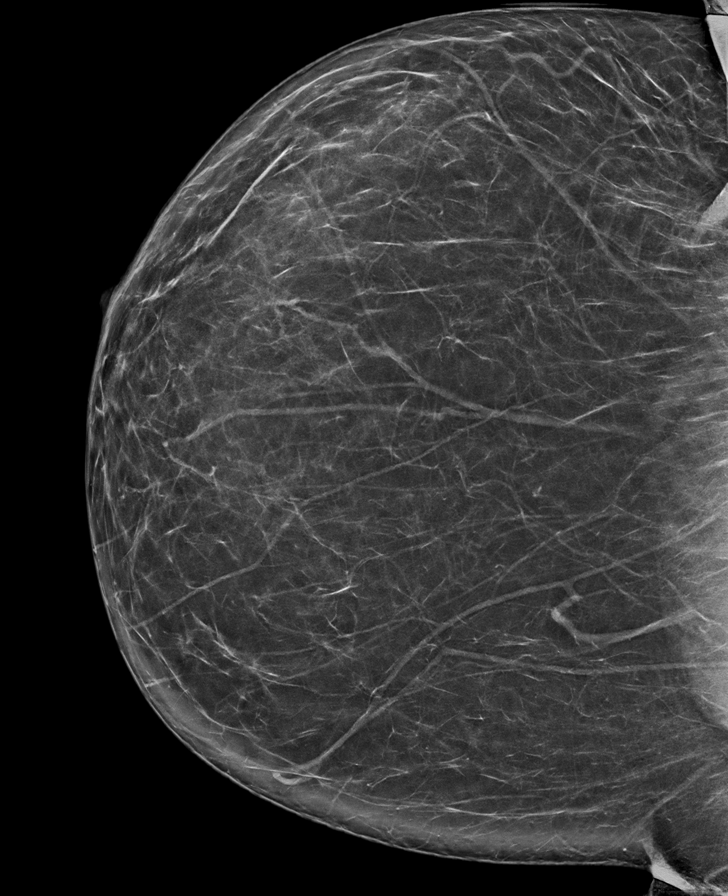

[R MLO synth-2D]
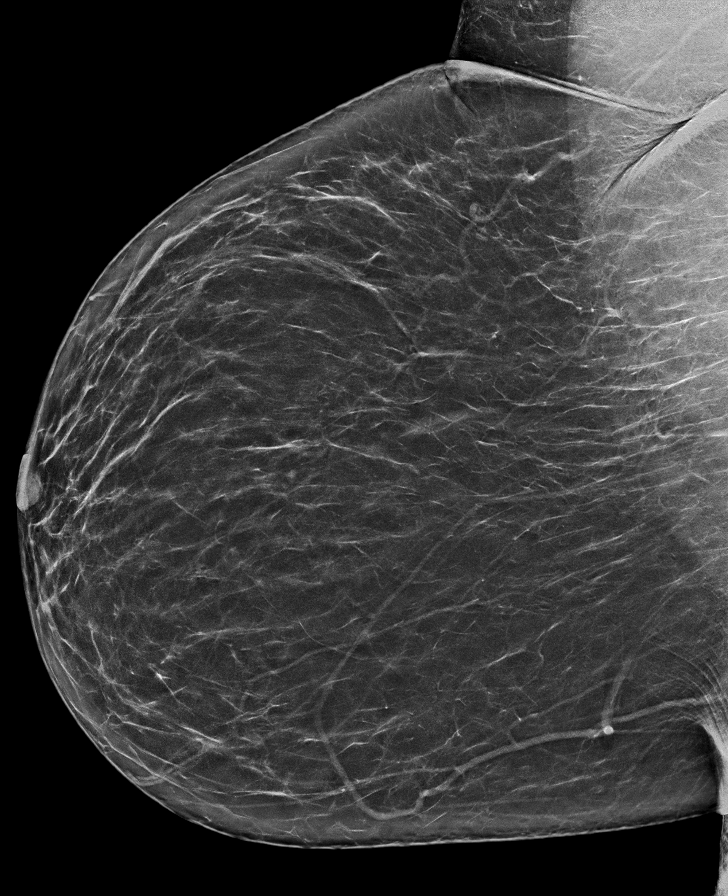

[L MLO synth-2D (2 of 3)]
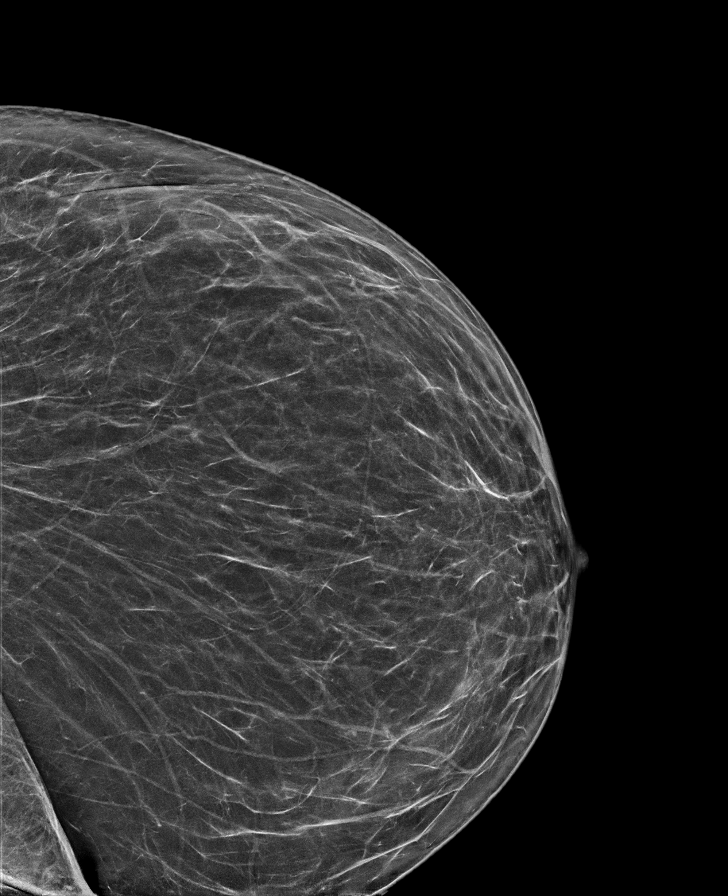

[L CC synth-2D]
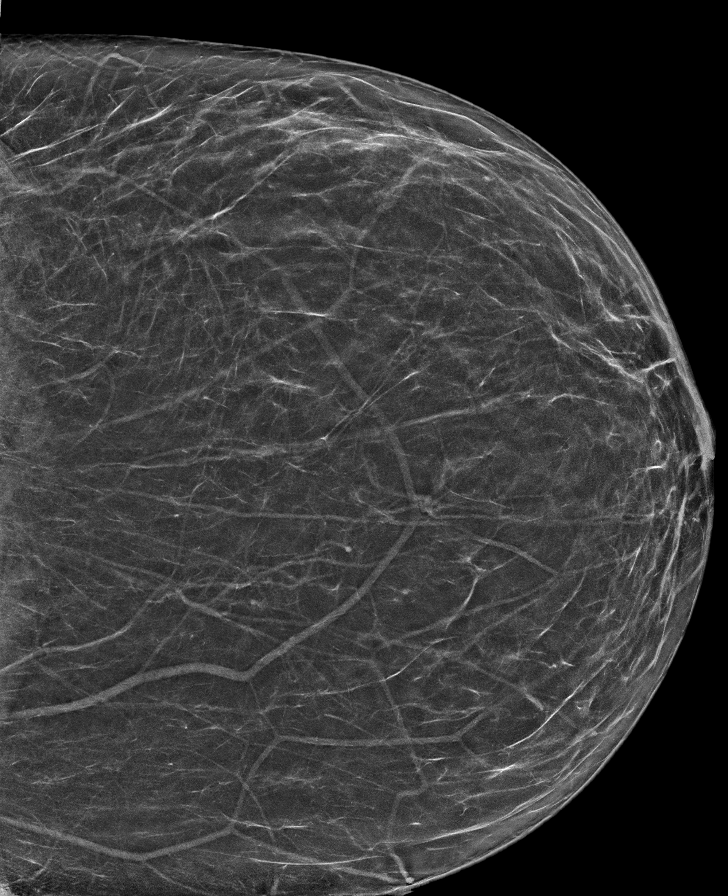

[L MLO synth-2D (3 of 3)]
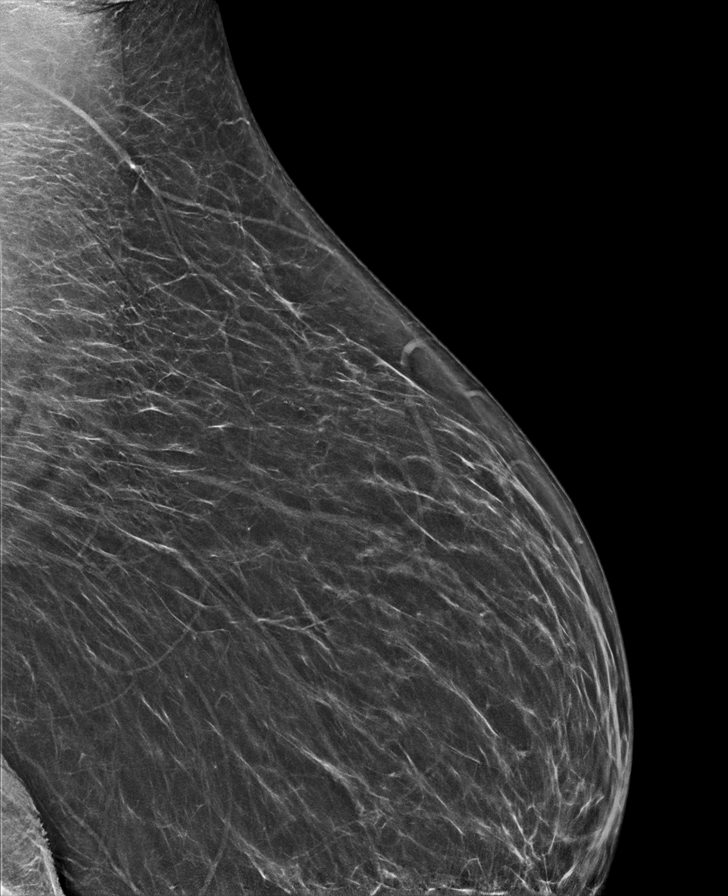

[R XCCM BREAST TOMOSYNTHESIS IMAGE tomo · tomo slice 39/76.0]
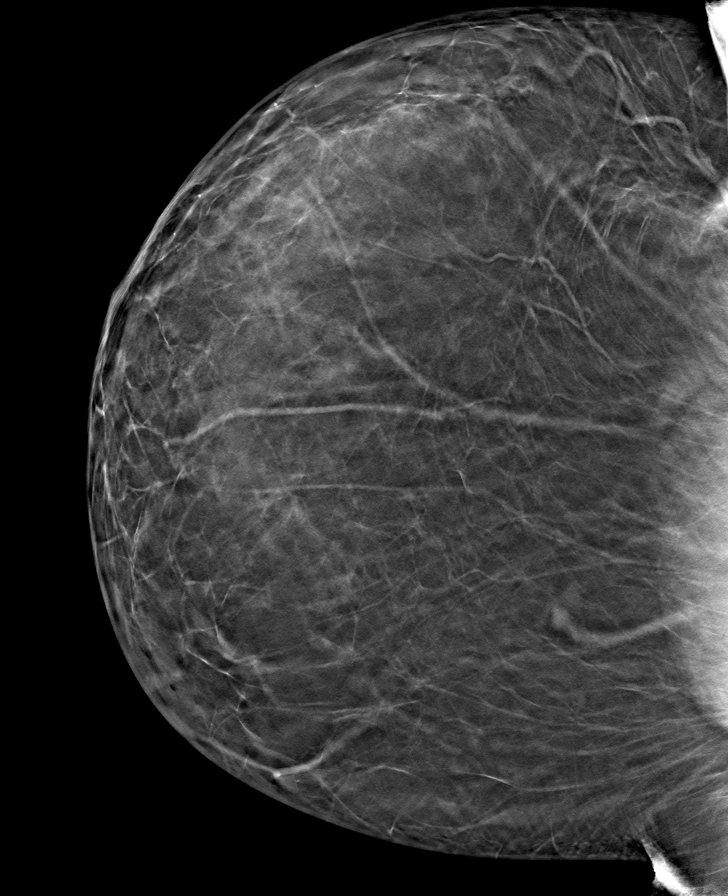

[8 of 40 positions shown; findings below may reference images not displayed]

ACR Breast Density Category b: There are scattered areas of
fibroglandular density.
FINDINGS: There are no findings suspicious for malignancy. Images were
processed with CAD.
IMPRESSION: No mammographic evidence of malignancy. A result letter of this
screening mammogram will be mailed directly to the patient.

RECOMMENDATION:
Screening mammogram in one year. (Code:[id number])

BI-RADS CATEGORY  1: Negative.

## 2021-11-01 ENCOUNTER — Other Ambulatory Visit: Payer: Self-pay | Admitting: Family Medicine

## 2021-11-01 DIAGNOSIS — Z1231 Encounter for screening mammogram for malignant neoplasm of breast: Secondary | ICD-10-CM

## 2021-12-01 ENCOUNTER — Ambulatory Visit (INDEPENDENT_AMBULATORY_CARE_PROVIDER_SITE_OTHER): Payer: Medicare Other

## 2021-12-01 ENCOUNTER — Other Ambulatory Visit: Payer: Self-pay

## 2021-12-01 DIAGNOSIS — Z1231 Encounter for screening mammogram for malignant neoplasm of breast: Secondary | ICD-10-CM

## 2022-12-21 ENCOUNTER — Other Ambulatory Visit: Payer: Self-pay

## 2022-12-21 ENCOUNTER — Other Ambulatory Visit (HOSPITAL_BASED_OUTPATIENT_CLINIC_OR_DEPARTMENT_OTHER): Payer: Self-pay | Admitting: Family Medicine

## 2022-12-21 ENCOUNTER — Encounter (HOSPITAL_COMMUNITY): Payer: Self-pay | Admitting: Nephrology

## 2022-12-21 DIAGNOSIS — Z1231 Encounter for screening mammogram for malignant neoplasm of breast: Secondary | ICD-10-CM

## 2023-01-24 ENCOUNTER — Ambulatory Visit (INDEPENDENT_AMBULATORY_CARE_PROVIDER_SITE_OTHER): Payer: Medicare Other

## 2023-01-24 ENCOUNTER — Encounter (HOSPITAL_COMMUNITY): Payer: Self-pay | Admitting: Nephrology

## 2023-01-24 DIAGNOSIS — Z1231 Encounter for screening mammogram for malignant neoplasm of breast: Secondary | ICD-10-CM

## 2023-05-08 ENCOUNTER — Encounter (HOSPITAL_COMMUNITY): Payer: Self-pay | Admitting: Nephrology

## 2023-05-25 LAB — LAB REPORT - SCANNED
A1c: 5.6
EGFR: 28

## 2023-09-19 ENCOUNTER — Encounter (HOSPITAL_COMMUNITY): Payer: Self-pay | Admitting: Nephrology

## 2023-09-26 ENCOUNTER — Encounter: Payer: Self-pay | Admitting: Dietician

## 2023-09-26 ENCOUNTER — Encounter: Payer: Medicare Other | Attending: Nephrology | Admitting: Dietician

## 2023-09-26 DIAGNOSIS — N185 Chronic kidney disease, stage 5: Secondary | ICD-10-CM | POA: Diagnosis present

## 2023-09-26 NOTE — Progress Notes (Signed)
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 Pt reports from that time until June 2024 she lost weight. She states she was feeling proud of herself and happy. Pt reports since June of this year she feels like she has consistently gained weight. Pt states she was around 230 lb and now states she is around 290 lb. Pt reports she also has gone through menopause recently and consistently takes prednisone and feels this could be related to her weight gain.   Pt states if she eats breakfast, she usually skips lunch. She reports if she skips breakfast she will have lunch and dinner. Pt reports sometimes she will start to eat a few bites of breakfast then not want any more.   Pt has husband, daughter, and 70 week old grandson at home. Pt reports sleep has been a little rough recently with the 34 week old grandson but is improving now.   Pt states she is planning to start back with physical activity. Pt states it has been 3 months since she has worked out. Pt reports she has a home gym (treadmill, bike, weights). Pt states she has been dealing with sciatica issues.   Estimated daily fluid intake: unable to estimate Supplements: ferrous sulfate, magnesium Sleep: pt states grandson was keeping her up, now sleeping 10:30pm-6am Stress / self-care: low stress Current average weekly physical activity: ADLs  24-Hr Dietary Recall First Meal: english  muffin with avocado toast, tomato, [place] bacon OR egg whites with avocado, tomato and brown sugar waffle Snack: none Second Meal: skips OR barberitos burrito bowl Snack: granola OR popcorn and chocolate covered almonds Third Meal: salad with veggies and sometimes egg OR cereal OR chicken and rice and vegetables Snack: cottage cheese, [place] pepperoni, and oregano OR cottage cheese and peaches Beverages: water, occasional sprite or ginger ale, iced coffee with almond milk and sweet creamer   NUTRITION DIAGNOSIS  NB-1.1 Food and nutrition-related knowledge deficit As related to lack of prior nutrition education by a nutrition professional.  As evidenced by pt report.   NUTRITION INTERVENTION  Nutrition education (E-1) on the following topics:   Plate Method Fruits & Vegetables: Aim to fill half your plate with a variety of fruits and vegetables. They are rich in vitamins, minerals, and fiber, and can help reduce the risk of chronic diseases. Choose a colorful assortment of fruits and vegetables to ensure you get a wide range of nutrients. Grains and Starches: Make at least half of your grain choices whole grains, such as brown rice, whole wheat bread, and oats. Whole grains provide fiber, which aids in digestion and healthy cholesterol levels. Aim for whole forms of starchy vegetables such as potatoes, sweet potatoes, beans, peas, and corn, which are fiber rich and provide many vitamins and minerals.  Protein: Incorporate lean sources of protein, such as poultry, fish, beans, nuts, and seeds, into your meals. Protein is essential for building and repairing tissues, staying full, balancing blood sugar, as well as supporting immune function. Dairy: Include  low-fat or fat-free dairy products like milk, yogurt, and cheese in your diet. Dairy foods are excellent sources of calcium and vitamin D, which are crucial for bone health.   Exercise/Physical Activity Finding an exercise you enjoy is crucial  for maintaining long-term fitness and overall health. Enjoyable activities are more likely to become regular habits, making it easier to stay consistent with physical activity. When you look forward to your workouts, exercise becomes a positive experience rather than a chore, reducing the likelihood of burnout or quitting. Enjoyable exercise also enhances mental well-being, as engaging in activities you love can boost mood, reduce stress, and provide a sense of accomplishment.  How to plan a kidney-friendly meal: Fill a 9-inch or 10-inch plate with: Fruits and/or vegetables Breads, cereals, or grains Protein   A healthy fat  Your body needs protein to help build muscle, repair tissue, and fight infection. If you have kidney disease, eating less protein can help protect your kidneys if you are not on dialysis. The most effective way to protect your health is to eat less red meat such as beef or pork and smaller protein portions and to choose plant-proteins as a meat alternative. If you are on dialysis, the protein serving is the size of your palm. If you are not on dialysis, the protein serving is 1/3 to  the size of your palm.   Eat at least 6 servings of grain daily and choose whole grains for at least half those servings.  Fruits and vegetables are an important part of a healthy diet and help increase your intake of fiber. Eat at least 5 servings of fresh, frozen, or canned fruits and vegetables daily. These foods are a source of potassium but you only need to limit how much you eat if your potassium level is high.  Products labeled as "low sodium" may use potassium chloride in place of sodium. Check the ingredient list to make sure you can safely eat low-sodium foods.  Your health care provider will let you know if you need to limit fluid intake. Less fluid will help you manage urine output, and avoid fluid retention which can cause shortness of breath, swelling, high blood pressure, and  increased strain on your heart and blood vessels.  Handouts Provided Include  Plate Method  Learning Style & Readiness for Change Teaching method utilized: Visual & Auditory  Demonstrated degree of understanding via: Teach Back  Barriers to learning/adherence to lifestyle change: none  Goals Established by Pt  Goal: exercise 3 days per week for 15 minutes. We will build up from here.   Goal: eat at least 3 times per day, even if something small.   When snacking, aim to include a complex carb and protein.  At meals, aim to include 1/2 plate non-starchy vegetables, 1/4 plate protein, and 1/4 plate complex carbs.    MONITORING & EVALUATION Dietary intake, weekly physical activity, and follow up in 3 months.  Next Steps  Patient is to call for questions.

## 2023-09-26 NOTE — Patient Instructions (Signed)
Goals Established by Pt  Goal: exercise 3 days per week for 15 minutes. We will build up from here.   Goal: eat at least 3 times per day, even if something small.   When snacking, aim to include a complex carb and protein.  At meals, aim to include 1/2 plate non-starchy vegetables, 1/4 plate protein, and 1/4 plate complex carbs.

## 2023-11-26 ENCOUNTER — Encounter (HOSPITAL_COMMUNITY): Payer: Self-pay | Admitting: Nephrology

## 2023-11-28 ENCOUNTER — Other Ambulatory Visit: Payer: Self-pay | Admitting: Family Medicine

## 2023-11-28 DIAGNOSIS — Z1231 Encounter for screening mammogram for malignant neoplasm of breast: Secondary | ICD-10-CM

## 2023-12-26 ENCOUNTER — Ambulatory Visit: Payer: Medicare Other | Admitting: Dietician

## 2024-01-30 ENCOUNTER — Ambulatory Visit: Payer: Medicare HMO

## 2024-02-20 ENCOUNTER — Encounter (HOSPITAL_COMMUNITY): Payer: Self-pay

## 2024-02-27 ENCOUNTER — Ambulatory Visit

## 2024-02-27 DIAGNOSIS — Z1231 Encounter for screening mammogram for malignant neoplasm of breast: Secondary | ICD-10-CM | POA: Diagnosis not present

## 2024-04-18 ENCOUNTER — Encounter (HOSPITAL_COMMUNITY): Payer: Self-pay

## 2024-04-18 ENCOUNTER — Encounter (HOSPITAL_COMMUNITY): Payer: Self-pay | Admitting: Nephrology

## 2024-04-18 ENCOUNTER — Emergency Department (HOSPITAL_COMMUNITY)

## 2024-04-18 ENCOUNTER — Other Ambulatory Visit: Payer: Self-pay

## 2024-04-18 ENCOUNTER — Emergency Department (HOSPITAL_COMMUNITY)
Admission: EM | Admit: 2024-04-18 | Discharge: 2024-04-18 | Disposition: A | Discharge status: Home / Self Care | Attending: Emergency Medicine | Admitting: Emergency Medicine

## 2024-04-18 DIAGNOSIS — G5712 Meralgia paresthetica, left lower limb: Secondary | ICD-10-CM | POA: Diagnosis not present

## 2024-04-18 DIAGNOSIS — Z79899 Other long term (current) drug therapy: Secondary | ICD-10-CM | POA: Insufficient documentation

## 2024-04-18 DIAGNOSIS — I10 Essential (primary) hypertension: Secondary | ICD-10-CM | POA: Diagnosis not present

## 2024-04-18 DIAGNOSIS — M79652 Pain in left thigh: Secondary | ICD-10-CM | POA: Diagnosis present

## 2024-04-18 MED ORDER — GABAPENTIN 300 MG PO CAPS: 600.0000 mg | ORAL_CAPSULE | Freq: Three times a day (TID) | ORAL | 0 refills | Status: DC

## 2024-04-18 MED ORDER — PREDNISONE 20 MG PO TABS: ORAL_TABLET | ORAL | 0 refills | Status: DC

## 2024-04-18 NOTE — Discharge Instructions (Addendum)
 Please take medication prescribed and follow-up closely with your doctor for further managements of your condition.  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

## 2024-04-18 NOTE — ED Triage Notes (Signed)
 Pt states she was using her walker to walk d/t lupus flare during rainy weather and feels like her sciatica was acting up, had plantar fasciitis and bursitis in hip. Pt was seen at [facility] on 6/29 and was given a decadrom IM injection that helped but pain has started back in left thigh. Pt states pain worsens w/touch. Pt denies numbness or tingling.

## 2024-04-18 NOTE — ED Provider Notes (Signed)
 [place] EMERGENCY DEPARTMENT AT [facility] Provider Note   CSN: [id number] Arrival date & time: 04/18/24  1018     Patient presents with: Leg Pain   Sara Frazier is a 53 y.o. female.   The history is provided by the patient, the spouse and medical records. No language interpreter was used.  Leg Pain    53 year old female history of lupus, PCOS, hypertension, complaining of leg pain.  Patient states she has been dealing with a lupus flare which is causing pain to her legs.  Patient states for the past several days she noticed a burning sensation to the top of her left thigh with tingling sensation and sensitivity.  She also noticed pain to her lower back that radiates to the back of her left leg down to her foot that felt similar to her prior sciatica.  She also complaining of plantar fasciitis involving her left foot that she is currently receiving treatment for.  She has had this for the past several weeks and initially received a steroid IM shot from her previous doctor visit.  It helped some but symptoms still persist.  She is unsure if this is due to a lupus flare.  She does not endorse any fever or chills no bowel bladder incontinence or saddle anesthesia no recent trauma and no rash.  She recently started on Zepbound for weight loss.  Prior to Admission medications   Medication Sig Start Date End Date Taking? Authorizing Provider  atorvastatin (LIPITOR) 10 MG tablet Take 10 mg by mouth daily. 05/21/18   [provider]  B Complex-C-Zn-Folic Acid (DIALYVITE 800 WITH ZINC) 0.8 MG TABS Take 1 tablet by mouth daily. 06/05/18   [provider]  calcium acetate (PHOSLO) 667 MG capsule TAKE 1 CAPSULE BY MOUTH THREE TIMES DAILY WITH MEALS 05/20/18   [provider]  carvedilol (COREG) 25 MG tablet Take 6.25 mg by mouth 2 (two) times daily.  02/09/16   [provider]  fluticasone (FLONASE) 50 MCG/ACT nasal spray INSTILL 2 SPRAYS INTO EACH NOSTRIL  ONCE DAILY 01/31/16   [provider]  gabapentin  (NEURONTIN ) 300 MG capsule Take 600 mg by mouth every other day. 02/04/16   [provider]  HYDROcodone-acetaminophen (NORCO) 7.5-325 MG tablet Take 1 tablet by mouth every 6 (six) hours as needed for pain. 12/07/15   [provider]  levothyroxine (SYNTHROID) 88 MCG tablet Take 88 mcg by mouth daily. 07/10/17 07/10/18  [provider]  montelukast (SINGULAIR) 10 MG tablet Take 10 mg by mouth at bedtime. 02/14/16 09/21/17  [provider]  Multiple Vitamin (MULTI-VITAMIN PO) Take 1 tablet by mouth daily.    [provider]  norethindrone (AYGESTIN) 5 MG tablet Take 5 mg by mouth daily. 06/20/18   [provider]    Allergies: Chlorhexidine gluconate, Lisinopril, and Nitroglycerin    Review of Systems  All other systems reviewed and are negative.   Updated Vital Signs BP 136/88 (BP Location: Right Arm)   Pulse 78   Temp 98.8 F (37.1 C)   Resp 18   Ht 5' 5.5 (1.664 m)   Wt (!) 137.9 kg   SpO2 100%   BMI 49.82 kg/m   Physical Exam Vitals and nursing note reviewed.  Constitutional:      General: She is not in acute distress.    Appearance: She is well-developed. She is obese.  HENT:     Head: Atraumatic.  Eyes:     Conjunctiva/sclera: Conjunctivae normal.  Cardiovascular:     Rate and Rhythm: Normal rate and regular rhythm.     Pulses: Normal pulses.     Heart sounds: Normal heart sounds.  Pulmonary:     Effort: Pulmonary effort is normal.  Abdominal:     Palpations: Abdomen is soft.     Tenderness: There is no abdominal tenderness.  Musculoskeletal:        General: Tenderness (Mild tenderness noted to lumbar and left lumbar sacral region.  Positive straight leg raise.  Tenderness and sensitivity to left anterior thigh without overlying skin changes.) present.     Cervical back: Neck supple.  Skin:    Capillary Refill: Capillary refill takes less than 2 seconds.      Findings: No rash.  Neurological:     Mental Status: She is alert. Mental status is at baseline.  Psychiatric:        Mood and Affect: Mood normal.     (all labs ordered are listed, but only abnormal results are displayed) Labs Reviewed - No data to display  EKG: None  Radiology: DG FEMUR MIN 2 VIEWS LEFT Result Date: 04/18/2024 CLINICAL DATA:  99497 Leg pain 00502 History of lupus, hip bursitis and plantar fasciitis. Increasing pain in left thigh following IM Decadron injection. EXAM: LEFT FEMUR 2 VIEWS; PELVIS - 1-2 VIEW COMPARISON:  None Available. FINDINGS: The mineralization and alignment are normal. There is no evidence of acute fracture or dislocation. No evidence of femoral head osteonecrosis. The hip and left knee joint spaces appear adequately maintained. No evidence of femoral head osteonecrosis. There are surgical clips in the right pelvis and multiple pelvic calcifications bilaterally, likely phleboliths. No focal soft tissue abnormalities are identified within the left thigh. IMPRESSION: No acute osseous findings or significant arthropathic changes. Multiple pelvic calcifications, likely phleboliths. Electronically Signed   By: Elsie Perone M.D.   On: 04/18/2024 11:17   DG Pelvis 1-2 Views Result Date: 04/18/2024 CLINICAL DATA:  99497 Leg pain 00502 History of lupus, hip bursitis and plantar fasciitis. Increasing pain in left thigh following IM Decadron injection. EXAM: LEFT FEMUR 2 VIEWS; PELVIS - 1-2 VIEW COMPARISON:  None Available. FINDINGS: The mineralization and alignment are normal. There is no evidence of acute fracture or dislocation. No evidence of femoral head osteonecrosis. The hip and left knee joint spaces appear adequately maintained. No evidence of femoral head osteonecrosis. There are surgical clips in the right pelvis and multiple pelvic calcifications bilaterally, likely phleboliths. No focal soft tissue abnormalities are identified within the left thigh.  IMPRESSION: No acute osseous findings or significant arthropathic changes. Multiple pelvic calcifications, likely phleboliths. Electronically Signed   By: Elsie Perone M.D.   On: 04/18/2024 11:17     Procedures   Medications Ordered in the ED - No data to display                                  Medical Decision Making  BP 136/88 (BP Location: Right Arm)   Pulse 78   Temp 98.8 F (37.1 C)   Resp 18   Ht 5' 5.5 (1.664 m)   Wt (!) 137.9 kg   SpO2 100%   BMI 49.82 kg/m   12:86 AM  53 year old female history of lupus, PCOS, hypertension, complaining of leg pain.  Patient states she has been dealing with a lupus flare which is causing pain to her legs.  Patient states for the past  several days she noticed a burning sensation to the top of her left thigh with tingling sensation and sensitivity.  She also noticed pain to her lower back that radiates to the back of her left leg down to her foot that felt similar to her prior sciatica.  She also complaining of plantar fasciitis involving her left foot that she is currently receiving treatment for.  She has had this for the past several weeks and initially received a steroid IM shot from her previous doctor visit.  It helped some but symptoms still persist.  She is unsure if this is due to a lupus flare.  She does not endorse any fever or chills no bowel bladder incontinence or saddle anesthesia no recent trauma and no rash.  She recently started on Zepbound for weight loss.  On exam, patient is resting comfortably appears to be in no acute discomfort.  She does have some tenderness to her lumbar spine and left positive straight leg raise.  She also has some sensitivity to her left anterior thigh with gentle palpation but no overlying skin changes.  She is neurovascular intact.  She is able to ambulate.  X-ray of the left femur and left pelvis was obtained independent view interpreted by me which showed no acute finding.  Vital signs  normal.  Symptoms suggestive of meralgia paresthetica based on the distributions of the lateral femoral cutaneous nerve.  She does have some component of sciatica involving the left lower extremity as well.  I have low suspicion for cauda equina causing her symptoms.  I have low suspicion for infectious etiology.  Will discharge home with prednisone , and gabapentin  and encourage patient to follow-up outpatient for further care.  Weight reduction recommended.  Encouraged patient to avoid tight clothing or belt use.       Final diagnoses:  Meralgia paresthetica of left side    ED Discharge Orders          Ordered    predniSONE  (DELTASONE ) 20 MG tablet  Status:  Discontinued        04/18/24 1151    gabapentin  (NEURONTIN ) 300 MG capsule  3 times daily,   Status:  Discontinued        04/18/24 1151    gabapentin  (NEURONTIN ) 300 MG capsule  3 times daily        04/18/24 1155    predniSONE  (DELTASONE ) 20 MG tablet        04/18/24 1155               Nivia Colon, PA-C 04/18/24 1200    Mannie Pac T, DO 04/19/24 1505

## 2024-07-03 ENCOUNTER — Emergency Department (HOSPITAL_COMMUNITY)

## 2024-07-03 ENCOUNTER — Emergency Department (HOSPITAL_COMMUNITY)
Admission: EM | Admit: 2024-07-03 | Discharge: 2024-07-04 | Disposition: A | Discharge status: Home / Self Care | Attending: Emergency Medicine | Admitting: Emergency Medicine

## 2024-07-03 ENCOUNTER — Other Ambulatory Visit: Payer: Self-pay

## 2024-07-03 DIAGNOSIS — I10 Essential (primary) hypertension: Secondary | ICD-10-CM | POA: Diagnosis not present

## 2024-07-03 DIAGNOSIS — R0602 Shortness of breath: Secondary | ICD-10-CM | POA: Diagnosis not present

## 2024-07-03 DIAGNOSIS — R109 Unspecified abdominal pain: Secondary | ICD-10-CM | POA: Diagnosis not present

## 2024-07-03 DIAGNOSIS — R079 Chest pain, unspecified: Secondary | ICD-10-CM | POA: Diagnosis not present

## 2024-07-03 DIAGNOSIS — Z79899 Other long term (current) drug therapy: Secondary | ICD-10-CM | POA: Diagnosis not present

## 2024-07-03 DIAGNOSIS — R002 Palpitations: Secondary | ICD-10-CM | POA: Insufficient documentation

## 2024-07-03 LAB — BASIC METABOLIC PANEL WITH GFR
Anion gap: 13 (ref 5–15)
BUN: 17 mg/dL (ref 6–20)
CO2: 23 mmol/L (ref 22–32)
Calcium: 9.4 mg/dL (ref 8.9–10.3)
Chloride: 103 mmol/L (ref 98–111)
Creatinine, Ser: 1.81 mg/dL — ABNORMAL HIGH (ref 0.44–1.00)
GFR, Estimated: 33 mL/min — ABNORMAL LOW (ref >60)
Glucose, Bld: 90 mg/dL (ref 70–99)
Potassium: 4 mmol/L (ref 3.5–5.1)
Sodium: 139 mmol/L (ref 135–145)

## 2024-07-03 LAB — CBC
HCT: 40.6 % (ref 36.0–46.0)
Hemoglobin: 12.2 g/dL (ref 12.0–15.0)
MCH: 26.3 pg (ref 26.0–34.0)
MCHC: 30 g/dL (ref 30.0–36.0)
MCV: 87.7 fL (ref 80.0–100.0)
Platelets: 201 K/uL (ref 150–400)
RBC: 4.63 MIL/uL (ref 3.87–5.11)
RDW: 15.9 % — ABNORMAL HIGH (ref 11.5–15.5)
WBC: 5.3 K/uL (ref 4.0–10.5)
nRBC: 0 % (ref 0.0–0.2)

## 2024-07-03 LAB — TROPONIN I (HIGH SENSITIVITY)
Troponin I (High Sensitivity): 9 ng/L (ref <18)
Troponin I (High Sensitivity): 9 ng/L (ref <18)

## 2024-07-03 NOTE — ED Triage Notes (Signed)
 Chest discomfort/palpitations throughout her chest that started over the weekend. +dizziness with or without movement.   Hx: kidney transplant 2 years ago and lupus

## 2024-07-03 NOTE — ED Provider Triage Note (Signed)
 Emergency Medicine Provider Triage Evaluation Note  Sara Frazier , a 53 y.o. female  was evaluated in triage.  Pt complains of palpitations since Saturday and weakness and dizziness today..  Review of Systems  Positive: Weakness, dizziness, palpitations Negative: Nausea vomiting diarrhea shortness of breath  Physical Exam  BP (!) 136/95 (BP Location: Left Arm)   Pulse 77   Temp 98.4 F (36.9 C) (Oral)   Resp 14   Ht 5' 5 (1.651 m)   Wt (!) 137 kg   SpO2 98%   BMI 50.26 kg/m  Gen:   Awake, no distress   Resp:  Normal effort cord auscultation all fields MSK:   Moves extremities without difficulty Other:  Mild left upper quadrant abdominal pain to palpation no other pain to palpation throughout abdomen.  Patient denies any GI symptoms or GU symptoms.  Medical Decision Making  Medically screening exam initiated at 6:11 PM.  Appropriate orders placed.  Sara Frazier was informed that the remainder of the evaluation will be completed by another provider, this initial triage assessment does not replace that evaluation, and the importance of remaining in the ED until their evaluation is complete.  53 year old female presents ED via EMS with complaints of palpitations for 6 days and weakness/dizziness today.  Patient reports Saturday she noted some intermittent palpitations that has no obvious triggers.  Patient reported she felt weak on Monday and rested and started to feel better.  Patient reported today she was walking from the salon to the car and has had rare episode of palpitations and felt dizzy.  Patient never had any syncope.  EMS was called and she was able to stand and walk around.  Patient reports she currently feels dizzy if she closes her eyes but not while sitting.  Patient denies any active chest pain or palpitation.   Sara Frazier RAMAN, [place] 07/03/24 8185

## 2024-07-04 ENCOUNTER — Emergency Department (HOSPITAL_COMMUNITY)

## 2024-07-04 LAB — D-DIMER, QUANTITATIVE: D-Dimer, Quant: <0.27 ug{FEU}/mL (ref 0.00–0.50)

## 2024-07-04 LAB — HEPATIC FUNCTION PANEL
ALT: 19 U/L (ref 0–44)
AST: 23 U/L (ref 15–41)
Albumin: 4 g/dL (ref 3.5–5.0)
Alkaline Phosphatase: 46 U/L (ref 38–126)
Bilirubin, Direct: 0.3 mg/dL — ABNORMAL HIGH (ref 0.0–0.2)
Indirect Bilirubin: 0.5 mg/dL (ref 0.3–0.9)
Total Bilirubin: 0.8 mg/dL (ref 0.0–1.2)
Total Protein: 7.9 g/dL (ref 6.5–8.1)

## 2024-07-04 LAB — BRAIN NATRIURETIC PEPTIDE: B Natriuretic Peptide: 10.1 pg/mL (ref 0.0–100.0)

## 2024-07-04 MED ORDER — SODIUM CHLORIDE 0.9 % IV BOLUS
500.0000 mL | Freq: Once | INTRAVENOUS | Status: AC
  Administered 2024-07-04: 500 mL via INTRAVENOUS

## 2024-07-04 NOTE — ED Provider Notes (Signed)
 [place] EMERGENCY DEPARTMENT AT [facility] Provider Note   CSN: [id number] Arrival date & time: 07/03/24  1647     Patient presents with: Palpitations   Sara Frazier is a 53 y.o. female with medical history of hypertension, lupus, PCOS, iron deficiency anemia, status post kidney transplant.  Patient presents to ED for evaluation of palpitations.  States that since Saturday she has had chest pressure/palpitations in her midsternal area.  Denies that this radiates.  States that at 1 point over the last few days she was short of breath while ambulating upstairs but denies that she is short of breath currently or has noticed that she is short of breath besides this 1 moment.  She denies leg swelling, nausea, vomiting.  She reports that she does have abdominal pain that began in the last 1 week.  Reports that she was assessment paramedics who pressed on her left upper abdomen and she noticed pain.  Denies any diarrhea but states her stools have been loose recently.  Denies blood in stool.  Denies any fevers.  Does endorse 1 episode of lightheadedness and dizziness earlier today but reports this lasted for about 30 seconds and resolved.  States she has seen cardiology in the past but does not see them currently.  Reports she takes amlodipine, carvedilol for blood pressure control.  Denies headache or blurred vision.    Palpitations Associated symptoms: no chest pain and no shortness of breath        Prior to Admission medications   Medication Sig Start Date End Date Taking? Authorizing Provider  atorvastatin (LIPITOR) 10 MG tablet Take 10 mg by mouth daily. 05/21/18   [provider]  B Complex-C-Zn-Folic Acid (DIALYVITE 800 WITH ZINC) 0.8 MG TABS Take 1 tablet by mouth daily. 06/05/18   [provider]  calcium acetate (PHOSLO) 667 MG capsule TAKE 1 CAPSULE BY MOUTH THREE TIMES DAILY WITH MEALS 05/20/18   [provider]  carvedilol (COREG) 25 MG tablet Take  6.25 mg by mouth 2 (two) times daily.  02/09/16   [provider]  fluticasone (FLONASE) 50 MCG/ACT nasal spray INSTILL 2 SPRAYS INTO EACH NOSTRIL ONCE DAILY 01/31/16   [provider]  gabapentin  (NEURONTIN ) 300 MG capsule Take 2 capsules (600 mg total) by mouth 3 (three) times daily. 04/18/24   Nivia Colon, PA-C  HYDROcodone-acetaminophen (NORCO) 7.5-325 MG tablet Take 1 tablet by mouth every 6 (six) hours as needed for pain. 12/07/15   [provider]  levothyroxine (SYNTHROID) 88 MCG tablet Take 88 mcg by mouth daily. 07/10/17 07/10/18  [provider]  montelukast (SINGULAIR) 10 MG tablet Take 10 mg by mouth at bedtime. 02/14/16 09/21/17  [provider]  Multiple Vitamin (MULTI-VITAMIN PO) Take 1 tablet by mouth daily.    [provider]  norethindrone (AYGESTIN) 5 MG tablet Take 5 mg by mouth daily. 06/20/18   [provider]  predniSONE  (DELTASONE ) 20 MG tablet 3 tabs po day one, then 2 tabs daily x 4 days 04/18/24   Nivia Colon, PA-C    Allergies: Chlorhexidine, Chlorhexidine gluconate, Lisinopril, Nitroglycerin, and Iodinated contrast media    Review of Systems  Respiratory:  Negative for shortness of breath.   Cardiovascular:  Positive for palpitations. Negative for chest pain.  Gastrointestinal:  Positive for abdominal pain.  All other systems reviewed and are negative.   Updated Vital Signs BP (!) 151/96   Pulse 72   Temp 98.6 F (37 C) (Oral)   Resp  13   Ht 5' 5 (1.651 m)   Wt (!) 137 kg   SpO2 100%   BMI 50.26 kg/m   Physical Exam Vitals and nursing note reviewed.  Constitutional:      General: She is not in acute distress.    Appearance: She is well-developed.  HENT:     Head: Normocephalic and atraumatic.  Eyes:     Conjunctiva/sclera: Conjunctivae normal.  Cardiovascular:     Rate and Rhythm: Normal rate and regular rhythm.     Heart sounds: No murmur heard.    Comments: Regular rate rhythm Pulmonary:      Effort: Pulmonary effort is normal. No respiratory distress.     Breath sounds: Normal breath sounds.  Abdominal:     Palpations: Abdomen is soft.     Tenderness: There is no abdominal tenderness.  Musculoskeletal:        General: No swelling.     Cervical back: Neck supple.     Right lower leg: No edema.     Left lower leg: No edema.  Skin:    General: Skin is warm and dry.     Capillary Refill: Capillary refill takes less than 2 seconds.  Neurological:     Mental Status: She is alert and oriented to person, place, and time. Mental status is at baseline.     Comments: Reassuring neurological examination without focal neurodeficits.  Psychiatric:        Mood and Affect: Mood normal.     (all labs ordered are listed, but only abnormal results are displayed) Labs Reviewed  BASIC METABOLIC PANEL WITH GFR - Abnormal; Notable for the following components:      Result Value   Creatinine, Ser 1.81 (*)    GFR, Estimated 33 (*)    All other components within normal limits  CBC - Abnormal; Notable for the following components:   RDW 15.9 (*)    All other components within normal limits  HEPATIC FUNCTION PANEL - Abnormal; Notable for the following components:   Bilirubin, Direct 0.3 (*)    All other components within normal limits  BRAIN NATRIURETIC PEPTIDE  D-DIMER, QUANTITATIVE  TROPONIN I (HIGH SENSITIVITY)  TROPONIN I (HIGH SENSITIVITY)    EKG: None  Radiology: CT ABDOMEN PELVIS WO CONTRAST Result Date: 07/04/2024 CLINICAL DATA:  Abdominal pain. Patient with right sided renal transplant. EXAM: CT ABDOMEN AND PELVIS WITHOUT CONTRAST TECHNIQUE: Multidetector CT imaging of the abdomen and pelvis was performed following the standard protocol without IV contrast. RADIATION DOSE REDUCTION: This exam was performed according to the departmental dose-optimization program which includes automated exposure control, adjustment of the mA and/or kV according to patient size and/or use of  iterative reconstruction technique. COMPARISON:  None Available. FINDINGS: Lower chest: Small amount of bilateral posterior pleural-parenchymal opacities most consistent with atelectasis. Lung bases are otherwise clear. The cardiac size normal. Hepatobiliary: No focal liver abnormality is seen. No gallstones, gallbladder wall thickening, or biliary dilatation. Pancreas: Normal in size, configuration, and noncontrast attenuation. No ductal dilatation. Spleen: Normal in size without focal abnormality. Adrenals/Urinary Tract: There is no adrenal mass. There are bilateral ovarian cysts renal atrophy consistent with end-stage renal failure. There is a 1.5 cm Bosniak 1 cyst of the posterior lower right kidney, Hounsfield density of 7. No follow-up imaging is recommended. No other contour deforming abnormality of the unenhanced kidneys is seen. No urinary stone or hydronephrosis. In the right iliac fossa there is a transplant kidney, with postsurgical changes. No stone or  hydronephrosis is seen. No contour deforming mass. The bladder is unremarkable. Stomach/Bowel: Unremarkable stomach. Normal caliber of unopacified small bowel. No mesenteric inflammation is seen. The subcecal appendix is normal. There is moderate fecal stasis ascending and transverse colon. No dilatation or wall thickening. Vascular/Lymphatic: No significant vascular findings are present. No enlarged abdominal or pelvic lymph nodes. Multiple pelvic phleboliths. Reproductive: Uterus and bilateral adnexa are unremarkable. Other: Scarring right lower abdominal wall and underlying subcutaneous soft tissues from prior renal transplant. There is a tiny umbilical fat hernia. There is no free fluid, free hemorrhage or free air, or focal inflammatory process. Musculoskeletal: Degenerative change lower thoracic and lumbar spine. No acute or significant osseous findings. IMPRESSION: 1. No acute noncontrast CT abnormality. 2. Constipation. 3. Right iliac fossa  transplant kidney with postsurgical changes. No stone or hydronephrosis. 4. Bilateral native renal atrophy with 1.5 cm Bosniak 1 cyst of the right kidney. 5. Tiny umbilical fat hernia. Electronically Signed   By: Francis Quam M.D.   On: 07/04/2024 02:56   DG Chest 2 View Result Date: 07/03/2024 CLINICAL DATA:  palpitations EXAM: CHEST - 2 VIEW COMPARISON:  None Available. FINDINGS: Likely tortuous thoracic aorta. The heart and mediastinal contours are within normal limits. No focal consolidation. No pulmonary edema. No pleural effusion. No pneumothorax. No acute osseous abnormality. IMPRESSION: No active cardiopulmonary disease. Electronically Signed   By: Morgane  Naveau M.D.   On: 07/03/2024 18:49    Procedures   Medications Ordered in the ED  sodium chloride  0.9 % bolus 500 mL (0 mLs Intravenous Stopped 07/04/24 0342)      Medical Decision Making Amount and/or Complexity of Data Reviewed Labs: ordered. Radiology: ordered.   This is a 53 year old female presenting to the ED out of concern of chest pressure and abdominal pain.  On exam, the patient is hemodynamically stable.  Afebrile and nontachycardic.  Lung sounds are clear bilaterally, no hypoxia.  Abdomen soft and compressible.  Neurological examination at baseline.  No edema to bilateral lower extremities.  Patient worked up utilizing CBC, BMP, troponin x 2, BNP, hepatic function panel, D-dimer, chest x-ray and EKG.  I have also added on CT abdomen pelvis.  Patient given 500 bag of fluid.  CBC here without leukocytosis or anemia.  Metabolic panel with no electrolyte derangement, creatinine 1.81 in the setting of renal transplant.  Chart reviewed, patient creatinine at baseline.  Anion gap 13, GFR 33.  D-dimer negative doubt PE.  Hepatic function panel unremarkable.  Troponins are flat 9, delta 9.  BNP not elevated 10.1.  Chest x-ray unremarkable, no pleural effusions or consolidations.  CT abdomen pelvis unremarkable, did show  constipation.  At this time the patient has no chest pain.  Reassuring workup in the ED.  Patient will be discharged home at this time, given referral to cardiology.  She was advised to return to the ED with new symptoms.  Encouraged to purchase MiraLAX to help with constipation.  She was given return precautions and she voiced understanding.  She is stable to discharge home.    Final diagnoses:  Palpitations    ED Discharge Orders          Ordered    Ambulatory referral to Cardiology        07/04/24 0350               Ruthell Lonni JULIANNA DEVONNA 07/04/24 0351    Griselda Norris, MD 07/05/24 2237

## 2024-07-04 NOTE — ED Notes (Signed)
 IV team consulted as pt is a hard stick

## 2024-07-04 NOTE — Discharge Instructions (Signed)
 It was a pleasure taking part in your care.  As discussed, your workup appears reassuring.  Your CT scan did show that you are constipated so please purchase MiraLAX to help with constipation.  Please continue taking all medications as prescribed.  I have referred you to cardiology, they will be calling you shortly to schedule an appointment.  Please return to the ED with new symptoms.

## 2024-08-29 ENCOUNTER — Encounter (HOSPITAL_COMMUNITY): Payer: Self-pay | Admitting: Nephrology

## 2024-08-29 ENCOUNTER — Ambulatory Visit: Attending: Cardiology | Admitting: Cardiology

## 2024-08-29 ENCOUNTER — Encounter: Payer: Self-pay | Admitting: Cardiology

## 2024-08-29 VITALS — BP 123/82 | HR 79 | Ht 65.5 in | Wt 295.0 lb

## 2024-08-29 DIAGNOSIS — I10 Essential (primary) hypertension: Secondary | ICD-10-CM | POA: Insufficient documentation

## 2024-08-29 DIAGNOSIS — R002 Palpitations: Secondary | ICD-10-CM | POA: Insufficient documentation

## 2024-08-29 NOTE — Patient Instructions (Signed)
 Testing/Procedures: Echocardiogram  Your physician has requested that you have an echocardiogram. Echocardiography is a painless test that uses sound waves to create images of your heart. It provides your doctor with information about the size and shape of your heart and how well your heart's chambers and valves are working. This procedure takes approximately one hour. There are no restrictions for this procedure. Please do NOT wear cologne, perfume, aftershave, or lotions (deodorant is allowed). Please arrive 15 minutes prior to your appointment time.  Please note: We ask at that you not bring children with you during ultrasound (echo/ vascular) testing. Due to room size and safety concerns, children are not allowed in the ultrasound rooms during exams. Our front office staff cannot provide observation of children in our lobby area while testing is being conducted. An adult accompanying a patient to their appointment will only be allowed in the ultrasound room at the discretion of the ultrasound technician under special circumstances. We apologize for any inconvenience.  30 day event monitor   Your physician has recommended that you wear an event monitor. Event monitors are medical devices that record the heart's electrical activity. Doctors most often us  these monitors to diagnose arrhythmias. Arrhythmias are problems with the speed or rhythm of the heartbeat. The monitor is a small, portable device. You can wear one while you do your normal daily activities. This is usually used to diagnose what is causing palpitations/syncope (passing out).   Follow-Up: At [facility], you and your health needs are our priority.  As part of our continuing mission to provide you with exceptional heart care, our providers are all part of one team.  This team includes your primary Cardiologist (physician) and Advanced Practice Providers or APPs (Physician Assistants and Nurse Practitioners) who all work  together to provide you with the care you need, when you need it.  Your next appointment:   As needed   Provider:   Newman JINNY Lawrence, MD

## 2024-08-29 NOTE — Progress Notes (Signed)
  Cardiology Office Note:  .   Date:  08/29/2024  ID:  Sara Frazier, DOB [birth year], MRN [id number] PCP: Sara Round, MD  [facility] Providers Cardiologist:  Sara Lawrence, MD PCP: Sara Round, MD  Chief Complaint  Patient presents with   Palpitations          Sara Frazier is a 53 y.o. female with hypertension, hyperlipidemia, CKD, s/p kidney transplant, referred for palpitations   Discussed the use of AI scribe software for clinical note transcription with the patient, who gave verbal consent to proceed.  History of Present Illness Sara Frazier is a 53 year old female with lupus and a history of kidney transplant who presents with palpitations.  She experiences episodes of heart racing and fluttering sensations that began one month ago, initially lasting a week and occurring throughout the day. Although the frequency has decreased, episodes now occur approximately once a week. There is no associated chest pain or shortness of breath.  Her medical history includes lupus, a kidney transplant, and polycystic ovary syndrome. She takes amlodipine and carvedilol for blood pressure management. Her kidney function remains stable.  Her father has congestive heart failure, but there is no family history of early-onset heart disease. She is retired, does not smoke or drink alcohol, and consumes minimal caffeine.      Vitals:   08/29/24 0840  BP: 123/82  Pulse: 79  SpO2: 99%      Review of Systems  Cardiovascular:  Positive for palpitations. Negative for chest pain, dyspnea on exertion, leg swelling and syncope.        Studies Reviewed: Sara Frazier        EKG 08/29/2024: Normal sinus rhythm Normal ECG When compared with ECG of 03-Jul-2024 16:59, No significant change was found    EKG 07/03/2024: Sinus rhythm 73 bpm Normal EKG  Labs 06/2024: Hb 12.2 Cr 1.81 BNP 10, trop HS 9, 9   Physical Exam Vitals and nursing note reviewed.  Constitutional:       General: She is not in acute distress.    Appearance: She is obese.  Neck:     Vascular: No JVD.  Cardiovascular:     Rate and Rhythm: Normal rate and regular rhythm.     Heart sounds: Normal heart sounds. No murmur heard. Pulmonary:     Effort: Pulmonary effort is normal.     Breath sounds: Normal breath sounds. No wheezing or rales.  Musculoskeletal:     Right lower leg: No edema.     Left lower leg: No edema.      VISIT DIAGNOSES:   ICD-10-CM   1. Palpitations  R00.2 EKG 12-Lead    ECHOCARDIOGRAM COMPLETE    Cardiac event monitor    2. Primary hypertension  I10 ECHOCARDIOGRAM COMPLETE    Cardiac event monitor       Sara Frazier is a 53 y.o. female with hypertension, hyperlipidemia, CKD, s/p kidney transplant, referred for palpitations  Assessment & Plan Palpitations Intermittent palpitations for a month. Normal EKG. Anxiety possible factor. - Ordered 30-day heart rhythm monitor. - Scheduled echocardiogram. - Advised continuous monitor use, except during showers.  Hypertension Well-controlled with amlodipine and carvedilol. - Continue current antihypertensive medications.     No orders of the defined types were placed in this encounter.    F/u as needed, unless any significant abnormalities follow up testing.  Signed, Sara JINNY Lawrence, MD

## 2024-09-26 ENCOUNTER — Telehealth: Payer: Self-pay | Admitting: Cardiology

## 2024-09-26 NOTE — Telephone Encounter (Signed)
 Pt was allergic to heart monitor and wanted to speak with a nurse about it. Please advise.

## 2024-09-26 NOTE — Telephone Encounter (Signed)
 Returned call to patient and answered all her questions. She is wearing a [employer] event monitor which has a box 3 for sensitive skin. She tried the box 3 necklace version and it still was irritating her. I recommend she leave the monitor off for a few days and call the company to inquire about a different type of sensitive skin electrode.

## 2024-10-06 ENCOUNTER — Other Ambulatory Visit (HOSPITAL_BASED_OUTPATIENT_CLINIC_OR_DEPARTMENT_OTHER): Payer: Self-pay | Admitting: Internal Medicine

## 2024-10-06 DIAGNOSIS — Z940 Kidney transplant status: Secondary | ICD-10-CM

## 2024-10-06 DIAGNOSIS — D849 Immunodeficiency, unspecified: Secondary | ICD-10-CM

## 2024-10-06 DIAGNOSIS — Z7952 Long term (current) use of systemic steroids: Secondary | ICD-10-CM

## 2024-10-13 ENCOUNTER — Ambulatory Visit (HOSPITAL_COMMUNITY)

## 2024-11-04 ENCOUNTER — Ambulatory Visit

## 2024-11-04 DIAGNOSIS — R002 Palpitations: Secondary | ICD-10-CM

## 2024-11-04 DIAGNOSIS — I10 Essential (primary) hypertension: Secondary | ICD-10-CM

## 2024-11-05 ENCOUNTER — Ambulatory Visit: Payer: Self-pay | Admitting: Cardiology

## 2024-11-05 DIAGNOSIS — I10 Essential (primary) hypertension: Secondary | ICD-10-CM | POA: Diagnosis not present

## 2024-11-05 DIAGNOSIS — R002 Palpitations: Secondary | ICD-10-CM

## 2024-11-07 ENCOUNTER — Ambulatory Visit (HOSPITAL_COMMUNITY)
Admission: RE | Admit: 2024-11-07 | Discharge: 2024-11-07 | Disposition: A | Source: Ambulatory Visit | Attending: Cardiology

## 2024-11-07 DIAGNOSIS — I10 Essential (primary) hypertension: Secondary | ICD-10-CM | POA: Insufficient documentation

## 2024-11-07 DIAGNOSIS — R002 Palpitations: Secondary | ICD-10-CM | POA: Diagnosis present

## 2024-11-09 LAB — ECHOCARDIOGRAM COMPLETE
Area-P 1/2: 4.93 cm2
S' Lateral: 2.4 cm
